# Patient Record
Sex: Female | Born: 1935 | Race: White | Hispanic: No | Smoking: Never smoker
Health system: Southern US, Community
[De-identification: ages and names within clinical notes are randomized; demographics above are authoritative.]

## PROBLEM LIST (undated history)

## (undated) HISTORY — PX: CHOLECYSTECTOMY: SHX55

## (undated) HISTORY — PX: ABDOMINAL HYSTERECTOMY: SHX81

---

## 2004-02-16 ENCOUNTER — Ambulatory Visit: Payer: Self-pay | Admitting: Family Medicine

## 2005-02-21 ENCOUNTER — Ambulatory Visit: Payer: Self-pay | Admitting: Family Medicine

## 2019-11-23 ENCOUNTER — Other Ambulatory Visit: Payer: Self-pay

## 2019-11-23 ENCOUNTER — Inpatient Hospital Stay
Admission: EM | Admit: 2019-11-23 | Discharge: 2019-11-28 | DRG: 521 | Disposition: A | Discharge status: Skilled Nursing Facility | Payer: Medicare Other | Source: Skilled Nursing Facility | Attending: Student | Admitting: Student

## 2019-11-23 ENCOUNTER — Emergency Department: Payer: Medicare Other

## 2019-11-23 DIAGNOSIS — Z96649 Presence of unspecified artificial hip joint: Secondary | ICD-10-CM

## 2019-11-23 DIAGNOSIS — S0003XA Contusion of scalp, initial encounter: Secondary | ICD-10-CM | POA: Diagnosis present

## 2019-11-23 DIAGNOSIS — Z9049 Acquired absence of other specified parts of digestive tract: Secondary | ICD-10-CM

## 2019-11-23 DIAGNOSIS — F0390 Unspecified dementia without behavioral disturbance: Secondary | ICD-10-CM | POA: Diagnosis not present

## 2019-11-23 DIAGNOSIS — Z90710 Acquired absence of both cervix and uterus: Secondary | ICD-10-CM

## 2019-11-23 DIAGNOSIS — S72001A Fracture of unspecified part of neck of right femur, initial encounter for closed fracture: Secondary | ICD-10-CM | POA: Diagnosis present

## 2019-11-23 DIAGNOSIS — Z419 Encounter for procedure for purposes other than remedying health state, unspecified: Secondary | ICD-10-CM

## 2019-11-23 DIAGNOSIS — F05 Delirium due to known physiological condition: Secondary | ICD-10-CM | POA: Diagnosis not present

## 2019-11-23 DIAGNOSIS — D62 Acute posthemorrhagic anemia: Secondary | ICD-10-CM | POA: Diagnosis not present

## 2019-11-23 DIAGNOSIS — K5900 Constipation, unspecified: Secondary | ICD-10-CM | POA: Diagnosis not present

## 2019-11-23 DIAGNOSIS — M8580 Other specified disorders of bone density and structure, unspecified site: Secondary | ICD-10-CM | POA: Diagnosis present

## 2019-11-23 DIAGNOSIS — F0391 Unspecified dementia with behavioral disturbance: Secondary | ICD-10-CM | POA: Diagnosis present

## 2019-11-23 DIAGNOSIS — W19XXXA Unspecified fall, initial encounter: Secondary | ICD-10-CM | POA: Diagnosis present

## 2019-11-23 DIAGNOSIS — Y92129 Unspecified place in nursing home as the place of occurrence of the external cause: Secondary | ICD-10-CM | POA: Diagnosis not present

## 2019-11-23 DIAGNOSIS — M25551 Pain in right hip: Secondary | ICD-10-CM

## 2019-11-23 DIAGNOSIS — W010XXA Fall on same level from slipping, tripping and stumbling without subsequent striking against object, initial encounter: Secondary | ICD-10-CM | POA: Diagnosis present

## 2019-11-23 DIAGNOSIS — D696 Thrombocytopenia, unspecified: Secondary | ICD-10-CM | POA: Diagnosis present

## 2019-11-23 DIAGNOSIS — J9601 Acute respiratory failure with hypoxia: Secondary | ICD-10-CM | POA: Diagnosis not present

## 2019-11-23 DIAGNOSIS — Z8249 Family history of ischemic heart disease and other diseases of the circulatory system: Secondary | ICD-10-CM | POA: Diagnosis not present

## 2019-11-23 DIAGNOSIS — R001 Bradycardia, unspecified: Secondary | ICD-10-CM | POA: Diagnosis present

## 2019-11-23 DIAGNOSIS — R739 Hyperglycemia, unspecified: Secondary | ICD-10-CM | POA: Diagnosis present

## 2019-11-23 DIAGNOSIS — Z20822 Contact with and (suspected) exposure to covid-19: Secondary | ICD-10-CM | POA: Diagnosis present

## 2019-11-23 DIAGNOSIS — R509 Fever, unspecified: Secondary | ICD-10-CM

## 2019-11-23 DIAGNOSIS — I441 Atrioventricular block, second degree: Secondary | ICD-10-CM | POA: Diagnosis present

## 2019-11-23 DIAGNOSIS — E0781 Sick-euthyroid syndrome: Secondary | ICD-10-CM | POA: Diagnosis present

## 2019-11-23 DIAGNOSIS — R339 Retention of urine, unspecified: Secondary | ICD-10-CM | POA: Diagnosis present

## 2019-11-23 DIAGNOSIS — R5381 Other malaise: Secondary | ICD-10-CM | POA: Diagnosis not present

## 2019-11-23 DIAGNOSIS — S0990XA Unspecified injury of head, initial encounter: Secondary | ICD-10-CM

## 2019-11-23 DIAGNOSIS — R338 Other retention of urine: Secondary | ICD-10-CM | POA: Diagnosis not present

## 2019-11-23 DIAGNOSIS — Z7189 Other specified counseling: Secondary | ICD-10-CM | POA: Diagnosis not present

## 2019-11-23 HISTORY — DX: Unspecified dementia, unspecified severity, without behavioral disturbance, psychotic disturbance, mood disturbance, and anxiety: F03.90

## 2019-11-23 HISTORY — DX: Unspecified fall, initial encounter: W19.XXXA

## 2019-11-23 LAB — TYPE AND SCREEN
ABO/RH(D): O POS
Antibody Screen: NEGATIVE

## 2019-11-23 LAB — CBC
HCT: 46.7 % — ABNORMAL HIGH (ref 36.0–46.0)
Hemoglobin: 16 g/dL — ABNORMAL HIGH (ref 12.0–15.0)
MCH: 31.3 pg (ref 26.0–34.0)
MCHC: 34.3 g/dL (ref 30.0–36.0)
MCV: 91.2 fL (ref 80.0–100.0)
Platelets: 183 10*3/uL (ref 150–400)
RBC: 5.12 MIL/uL — ABNORMAL HIGH (ref 3.87–5.11)
RDW: 12.7 % (ref 11.5–15.5)
WBC: 9.3 10*3/uL (ref 4.0–10.5)
nRBC: 0 % (ref 0.0–0.2)

## 2019-11-23 LAB — COMPREHENSIVE METABOLIC PANEL
ALT: 18 U/L (ref 0–44)
AST: 24 U/L (ref 15–41)
Albumin: 4.4 g/dL (ref 3.5–5.0)
Alkaline Phosphatase: 80 U/L (ref 38–126)
Anion gap: 13 (ref 5–15)
BUN: 16 mg/dL (ref 8–23)
CO2: 27 mmol/L (ref 22–32)
Calcium: 9.6 mg/dL (ref 8.9–10.3)
Chloride: 99 mmol/L (ref 98–111)
Creatinine, Ser: 0.85 mg/dL (ref 0.44–1.00)
GFR calc Af Amer: >60 mL/min (ref >60)
GFR calc non Af Amer: >60 mL/min (ref >60)
Glucose, Bld: 143 mg/dL — ABNORMAL HIGH (ref 70–99)
Potassium: 3.9 mmol/L (ref 3.5–5.1)
Sodium: 139 mmol/L (ref 135–145)
Total Bilirubin: 1.1 mg/dL (ref 0.3–1.2)
Total Protein: 8.7 g/dL — ABNORMAL HIGH (ref 6.5–8.1)

## 2019-11-23 LAB — MRSA PCR SCREENING: MRSA by PCR: NEGATIVE

## 2019-11-23 LAB — APTT: aPTT: 34 seconds (ref 24–36)

## 2019-11-23 LAB — PROTIME-INR
INR: 1 (ref 0.8–1.2)
Prothrombin Time: 12.5 seconds (ref 11.4–15.2)

## 2019-11-23 LAB — SARS CORONAVIRUS 2 BY RT PCR (HOSPITAL ORDER, PERFORMED IN ~~LOC~~ HOSPITAL LAB): SARS Coronavirus 2: NEGATIVE

## 2019-11-23 MED ORDER — OXYCODONE HCL 5 MG PO TABS
5.0000 mg | ORAL_TABLET | Freq: Once | ORAL | Status: AC
  Administered 2019-11-23: 5 mg via ORAL
  Filled 2019-11-23: qty 1

## 2019-11-23 MED ORDER — METHOCARBAMOL 500 MG PO TABS
500.0000 mg | ORAL_TABLET | Freq: Three times a day (TID) | ORAL | Status: DC | PRN
  Administered 2019-11-23: 500 mg via ORAL
  Filled 2019-11-23 (×3): qty 1

## 2019-11-23 MED ORDER — CEFAZOLIN SODIUM-DEXTROSE 2-4 GM/100ML-% IV SOLN
2.0000 g | Freq: Once | INTRAVENOUS | Status: AC
  Administered 2019-11-24: 2 g via INTRAVENOUS
  Filled 2019-11-23: qty 100

## 2019-11-23 MED ORDER — SENNOSIDES-DOCUSATE SODIUM 8.6-50 MG PO TABS: 1.0000 | ORAL_TABLET | Freq: Every evening | ORAL | Status: DC | PRN

## 2019-11-23 MED ORDER — ACETAMINOPHEN 325 MG PO TABS: 650.0000 mg | ORAL_TABLET | Freq: Four times a day (QID) | ORAL | Status: DC | PRN

## 2019-11-23 MED ORDER — ONDANSETRON HCL 4 MG/2ML IJ SOLN
4.0000 mg | Freq: Three times a day (TID) | INTRAMUSCULAR | Status: DC | PRN
  Administered 2019-11-23 – 2019-11-24 (×2): 4 mg via INTRAVENOUS
  Filled 2019-11-23 (×2): qty 2

## 2019-11-23 MED ORDER — HALOPERIDOL LACTATE 5 MG/ML IJ SOLN
1.0000 mg | Freq: Once | INTRAMUSCULAR | Status: AC
  Administered 2019-11-23: 1 mg via INTRAVENOUS
  Filled 2019-11-23: qty 1

## 2019-11-23 MED ORDER — ACETAMINOPHEN 500 MG PO TABS
1000.0000 mg | ORAL_TABLET | Freq: Once | ORAL | Status: AC
  Administered 2019-11-23: 1000 mg via ORAL
  Filled 2019-11-23: qty 2

## 2019-11-23 MED ORDER — ATROPINE SULFATE 1 MG/10ML IJ SOSY: 0.5000 mg | PREFILLED_SYRINGE | INTRAMUSCULAR | Status: DC | PRN

## 2019-11-23 MED ORDER — OXYCODONE-ACETAMINOPHEN 5-325 MG PO TABS
1.0000 | ORAL_TABLET | ORAL | Status: DC | PRN
  Administered 2019-11-23 – 2019-11-24 (×2): 1 via ORAL
  Filled 2019-11-23 (×3): qty 1

## 2019-11-23 MED ORDER — MORPHINE SULFATE (PF) 2 MG/ML IV SOLN
0.5000 mg | INTRAVENOUS | Status: DC | PRN
  Administered 2019-11-23 (×2): 0.5 mg via INTRAVENOUS
  Filled 2019-11-23 (×2): qty 1

## 2019-11-23 NOTE — ED Notes (Signed)
Pt bradying down into the 30s for mins at a time then goes back up to 70s with stimulation. BP stable and mentation remains the same. Dr. Clyde Lundborg updated.

## 2019-11-23 NOTE — Anesthesia Preprocedure Evaluation (Addendum)
Anesthesia Evaluation  Patient identified by MRN, date of birth, ID band Patient awake    Reviewed: Allergy & Precautions, H&P , NPO status , Patient's Chart, lab work & pertinent test results  Airway Mallampati: II  TM Distance: >3 FB Neck ROM: limited    Dental  (+) Dental Advidsory Given   Pulmonary neg pulmonary ROS, neg COPD,    Pulmonary exam normal        Cardiovascular (-) Past MI and (-) Cardiac Stents Normal cardiovascular exam+ dysrhythmias (Asymptomatic bradycardia to 30's noted on admission c/w Mobitz type 1 AV block.  Cardiology has cleared her for surgery.  Bradycardia should be responsive to atropine if needed)      Neuro/Psych PSYCHIATRIC DISORDERS (moderate to severe dementia) Dementia negative neurological ROS     GI/Hepatic negative GI ROS, Neg liver ROS,   Endo/Other  negative endocrine ROS  Renal/GU      Musculoskeletal   Abdominal   Peds  Hematology negative hematology ROS (+)   Anesthesia Other Findings Past Medical History: 11/23/2019: Dementia (HCC)     Comment:  Per daughter 11/23/2019: Fall Past Surgical History: No date: ABDOMINAL HYSTERECTOMY No date: CHOLECYSTECTOMY  BMI   Body Mass Index: 30.24 kg/m      Reproductive/Obstetrics negative OB ROS                           Anesthesia Physical Anesthesia Plan  ASA: III  Anesthesia Plan: Spinal   Post-op Pain Management:    Induction:   PONV Risk Score and Plan: Propofol infusion and TIVA  Airway Management Planned: Natural Airway and Simple Face Mask  Additional Equipment:   Intra-op Plan:   Post-operative Plan:   Informed Consent: I have reviewed the patients History and Physical, chart, labs and discussed the procedure including the risks, benefits and alternatives for the proposed anesthesia with the patient or authorized representative who has indicated his/her understanding and acceptance.      Dental Advisory Given  Plan Discussed with: Anesthesiologist, CRNA and Surgeon  Anesthesia Plan Comments: (Consent for anesthesia obtained from daughter Verdene Rio at [phone]. Plan for spinal.  KR)      Anesthesia Quick Evaluation

## 2019-11-23 NOTE — Progress Notes (Addendum)
Patient very agitated trying to get up out of bed. MD ordered Haldol for her agitation. Have to remind her several times a minute leg is broken and can not get out of bed. Will need a sitter. Easily agitated and irritable. Mats have been placed on floor beside bed on both sides for safety. Ordering tele sitter for patient.

## 2019-11-23 NOTE — Consult Note (Signed)
ORTHOPAEDIC CONSULTATION  REQUESTING PHYSICIAN: Jane Harp, MD  Chief Complaint:   R hip pain  History of Present Illness: History obtained from discussion with medical providers, the patient's daughter, and review of medical record.  Jane Bailey is a 86 y.o. female with a history of significant dementia who had a fall at her skilled nursing facility that is her primary residence.  The patient noted immediate hip pain and inability to ambulate.  The patient ambulates unassisted at baseline.  She also had a fall a few months ago according to her daughter.  Pain is rated a 10 out of 10 in severity.  Pain is improved with rest and immobilization.  Pain is worse with any sort of movement.  X-rays in the emergency department show a R displaced femoral neck fracture.  Past Medical History:  Diagnosis Date  . Dementia (HCC) 11/23/2019   Per daughter  . Fall 11/23/2019   History reviewed. No pertinent surgical history. Social History   Socioeconomic History  . Marital status: Unknown    Spouse name: Not on file  . Number of children: Not on file  . Years of education: Not on file  . Highest education level: Not on file  Occupational History  . Not on file  Tobacco Use  . Smoking status: Never Smoker  . Smokeless tobacco: Never Used  Vaping Use  . Vaping Use: Never used  Substance and Sexual Activity  . Alcohol use: Never  . Drug use: Never  . Sexual activity: Not Currently  Other Topics Concern  . Not on file  Social History Narrative  . Not on file   Social Determinants of Health   Financial Resource Strain:   . Difficulty of Paying Living Expenses:   Food Insecurity:   . Worried About Programme researcher, broadcasting/film/video in the Last Year:   . Barista in the Last Year:   Transportation Needs:   . Freight forwarder (Medical):   Marland Kitchen Lack of Transportation (Non-Medical):   Physical Activity:   . Days of  Exercise per Week:   . Minutes of Exercise per Session:   Stress:   . Feeling of Stress :   Social Connections:   . Frequency of Communication with Friends and Family:   . Frequency of Social Gatherings with Friends and Family:   . Attends Religious Services:   . Active Member of Clubs or Organizations:   . Attends Banker Meetings:   Marland Kitchen Marital Status:    No family history on file. No Known Allergies Prior to Admission medications   Medication Sig Start Date End Date Taking? Authorizing Provider  Multiple Vitamins-Minerals (CERTA PLUS SENIOR PO) Take 1 tablet by mouth daily.   Yes [provider]  [facility] powder Apply 1 application topically in the morning and at bedtime. 10/22/19  Yes [provider]   Recent Labs    11/23/19 0840  WBC 9.3  HGB 16.0*  HCT 46.7*  PLT 183  K 3.9  CL 99  CO2 27  BUN 16  CREATININE 0.85  GLUCOSE 143*  CALCIUM 9.6  INR 1.0   CT Head Wo Contrast  Result Date: 11/23/2019 CLINICAL DATA:  Larey Seat this morning.  Patient with baseline dementia. EXAM: CT HEAD WITHOUT CONTRAST TECHNIQUE: Contiguous axial images were obtained from the base of the skull through the vertex without intravenous contrast. COMPARISON:  None. FINDINGS: Brain: No evidence of acute infarction, hemorrhage, hydrocephalus, extra-axial collection or mass lesion/mass effect.  Mild patchy areas of white matter hypoattenuation are noted consistent with chronic microvascular ischemic change. Vascular: No hyperdense vessel or unexpected calcification. Skull: Normal. Negative for fracture or focal lesion. Sinuses/Orbits: Visualized globes and orbits are unremarkable. The visualized sinuses and mastoid air cells are clear. Other: Small right parietal scalp hematoma. IMPRESSION: 1. No acute intracranial abnormalities. 2. Small right parietal scalp hematoma.  No skull fracture Electronically Signed   By: Amie Portland M.D.   On: 11/23/2019 10:22   DG Hip Unilat W or Wo  Pelvis 2-3 Views Right  Result Date: 11/23/2019 CLINICAL DATA:  Fall with RIGHT hip deformity. EXAM: DG HIP (WITH OR WITHOUT PELVIS) 2-3V RIGHT COMPARISON:  None FINDINGS: Osteopenia with complete displacement of a RIGHT femoral neck fracture and superior migration of the distal femur relative to the femoral head. Femoral head appears located on submitted views. Degree of comminution may be present in there is sclerosis along the fracture margin some of this irregularity could be due to overlapping bone. Is and degree of osteopenia. No additional fractures. IMPRESSION: Complete displacement of a RIGHT femoral neck fracture. Superior migration of the distal femur relative to femoral head. Character of the bone is abnormal, potentially related to the degree of osteopenia. Based on appearance pathologic fracture could also be considered. Electronically Signed   By: Donzetta Kohut M.D.   On: 11/23/2019 09:53     Positive ROS: All other systems have been reviewed and were otherwise negative with the exception of those mentioned in the HPI and as above.  Physical Exam: BP (!) 134/65   Pulse 66   Temp 98 F (36.7 C) (Oral)   Resp 18   Ht 5' 7.5" (1.715 m)   Wt 88.9 kg   SpO2 90%   BMI 30.24 kg/m  General:  Alert, no acute distress Psychiatric:  Patient is NOT competent for consent with normal mood and affect, not agitated   Cardiovascular:  No pedal edema, regular rate and rhythm Respiratory:  No wheezing, non-labored breathing GI:  Abdomen is soft and non-tender Skin:  No lesions in the area of chief complaint, no erythema Neurologic:  Sensation intact distally, CN grossly intact Lymphatic:  No axillary or cervical lymphadenopathy  Orthopedic Exam:  RLE: 5/5 DF/PF/EHL SILT s/s/t/sp/dp distr Foot wwp +Log roll/axial load   X-rays:  As above: R displaced femoral neck fracture.  Assessment/Plan: [name] is a 86 y.o. female with a R displaced femoral neck fracture.   1. I  discussed the various treatment options including both surgical and non-surgical management of her fracture with the patient's daughter (medical PoA). We discussed the high risk of perioperative complications due to patient's age, dementia, and other co-morbidities. After discussion of risks, benefits, and alternatives to surgery, the patient's daughter was in agreement to proceed with surgery. The goals of surgery would be to provide adequate pain relief and allow for mobilization. Plan for surgery is R hip hemiarthroplasty tomorrow, 11/24/2019. 2. NPO after midnight 3. Hold anticoagulation in advance of OR 4. Admit to Hospitalist service for medical optimization.  Of note, the patient was noted to have possible second-degree AV block with bradycardia.  The cardiology team was consulted and she was cleared to proceed with surgery at this time.     Signa Kell   11/23/2019 12:49 PM

## 2019-11-23 NOTE — H&P (Addendum)
History and Physical    Jane Bailey OVZ:[id number] DOB: 06-30-1930 DOA: 11/23/2019  Referring MD/NP/PA:   PCP: Drue Flirt, MD   Patient coming from:  The patient is coming from SNF.  At baseline, pt is dependent for most of ADL.        Chief Complaint: fall and right hip pain  HPI: Jane Bailey is a 86 y.o. female with medical history significant of dementia, who presents with fall and left hip pain.  Per report, patient fell accidentally in the SNF in early morning, no loss of consciousness.  She injured her right hip and right side of the head.  Patient complains of right hip pain, which is constant, sharp, severe.  No leg numbness. She also has some mild headache, No neck pain. Patient does not have facial droop or slurred speech.  Denies chest pain, shortness breath, cough, fever or chills.  No nausea vomiting, diarrhea, abdominal pain, symptoms of UTI.   Of note, patient was found to have bradycardia ED with heart rate down to 30s, then come back to 40-60s.  Stat EKG was done, which showed Mobitz type I AV block.  ED Course: pt was found to have WBC 9.3, negative COVID-19 PCR, electrolytes renal function okay, temperature normal, blood pressure 136/81, RR 26, oxygen saturation 93-95% on room air. CT-head negative for acute intracranial abnormalities, but it showed a small right parietal scalp hematoma.  Patient is admitted to progressive bed as inpatient.  Orthopedic surgeon, Dr. Allena Katz and Dr. Gwen Pounds of cardiology were consulted.  X-ray of right hip/pelvis: Complete displacement of a RIGHT femoral neck fracture. Superior migration of the distal femur relative to femoral head. Character of the bone is abnormal, potentially related to the degree of osteopenia. Based on appearance pathologic fracture could also be considered.   Review of Systems:   General: no fevers, chills, no body weight gain, has fatigue HEENT: no blurry vision, hearing changes or sore  throat Respiratory: no dyspnea, coughing, wheezing CV: no chest pain, no palpitations GI: no nausea, vomiting, abdominal pain, diarrhea, constipation GU: no dysuria, burning on urination, increased urinary frequency, hematuria  Ext: no leg edema Neuro: no unilateral weakness, numbness, or tingling, no vision change or hearing loss. Has fall. Skin: no rash, no skin tear. MSK: has right hip pain Heme: No easy bruising.  Travel history: No recent long distant travel.  Allergy: No Known Allergies  Past Medical History:  Diagnosis Date  . Dementia (HCC) 11/23/2019   Per daughter  . Fall 11/23/2019    Past Surgical History:  Procedure Laterality Date  . ABDOMINAL HYSTERECTOMY    . CHOLECYSTECTOMY      Social History:  reports that she has never smoked. She has never used smokeless tobacco. She reports that she does not drink alcohol and does not use drugs.  Family History:  Family History  Problem Relation Age of Onset  . Heart attack Father   . Heart attack Brother      Prior to Admission medications   Not on File    Physical Exam: Vitals:   11/23/19 1432 11/23/19 1433 11/23/19 1434 11/23/19 1435  BP:      Pulse: 71 66 68 66  Resp: 16 (!) 26 (!) 26 19  Temp:      TempSrc:      SpO2: 91% 90% 97% 93%  Weight:      Height:       General: Not in acute distress HEENT:  Eyes: PERRL, EOMI, no scleral icterus.       ENT: No discharge from the ears and nose, no pharynx injection, no tonsillar enlargement.        Neck: No JVD, no bruit, no mass felt. Heme: No neck lymph node enlargement. Cardiac: S1/S2, RRR, No murmurs, No gallops or rubs. Respiratory: No rales, wheezing, rhonchi or rubs. GI: Soft, nondistended, nontender, no rebound pain, no organomegaly, BS present. GU: No hematuria Ext: No pitting leg edema bilaterally. 1+DP/PT pulse bilaterally. Musculoskeletal: has right hip tenderness. Skin: No rashes.  Neuro: Alert, oriented X3, cranial nerves II-XII  grossly intact, moves all extremities. Psych: Patient is not psychotic, no suicidal or hemocidal ideation.  Labs on Admission: I have personally reviewed following labs and imaging studies  CBC: Recent Labs  Lab 11/23/19 0840  WBC 9.3  HGB 16.0*  HCT 46.7*  MCV 91.2  PLT 183   Basic Metabolic Panel: Recent Labs  Lab 11/23/19 0840  NA 139  K 3.9  CL 99  CO2 27  GLUCOSE 143*  BUN 16  CREATININE 0.85  CALCIUM 9.6   GFR: Estimated Creatinine Clearance: 53.9 mL/min (by C-G formula based on SCr of 0.85 mg/dL). Liver Function Tests: Recent Labs  Lab 11/23/19 0840  AST 24  ALT 18  ALKPHOS 80  BILITOT 1.1  PROT 8.7*  ALBUMIN 4.4   No results for input(s): LIPASE, AMYLASE in the last 168 hours. No results for input(s): AMMONIA in the last 168 hours. Coagulation Profile: Recent Labs  Lab 11/23/19 0840  INR 1.0   Cardiac Enzymes: No results for input(s): CKTOTAL, CKMB, CKMBINDEX, TROPONINI in the last 168 hours. BNP (last 3 results) No results for input(s): PROBNP in the last 8760 hours. HbA1C: No results for input(s): HGBA1C in the last 72 hours. CBG: No results for input(s): GLUCAP in the last 168 hours. Lipid Profile: No results for input(s): CHOL, HDL, LDLCALC, TRIG, CHOLHDL, LDLDIRECT in the last 72 hours. Thyroid Function Tests: No results for input(s): TSH, T4TOTAL, FREET4, T3FREE, THYROIDAB in the last 72 hours. Anemia Panel: No results for input(s): VITAMINB12, FOLATE, FERRITIN, TIBC, IRON, RETICCTPCT in the last 72 hours. Urine analysis: No results found for: COLORURINE, APPEARANCEUR, LABSPEC, PHURINE, GLUCOSEU, HGBUR, BILIRUBINUR, KETONESUR, PROTEINUR, UROBILINOGEN, NITRITE, LEUKOCYTESUR Sepsis Labs: @LABRCNTIP (procalcitonin:4,lacticidven:4) ) Recent Results (from the past 240 hour(s))  SARS Coronavirus 2 by RT PCR (hospital order, performed in [facility] hospital lab) Nasopharyngeal Nasopharyngeal Swab     Status: None   Collection Time: 11/23/19   8:41 AM   Specimen: Nasopharyngeal Swab  Result Value Ref Range Status   SARS Coronavirus 2 NEGATIVE NEGATIVE Final    Comment: (NOTE) SARS-CoV-2 target nucleic acids are NOT DETECTED.  The SARS-CoV-2 RNA is generally detectable in upper and lower respiratory specimens during the acute phase of infection. The lowest concentration of SARS-CoV-2 viral copies this assay can detect is 250 copies / mL. A negative result does not preclude SARS-CoV-2 infection and should not be used as the sole basis for treatment or other patient management decisions.  A negative result may occur with improper specimen collection / handling, submission of specimen other than nasopharyngeal swab, presence of viral mutation(s) within the areas targeted by this assay, and inadequate number of viral copies (<250 copies / mL). A negative result must be combined with clinical observations, patient history, and epidemiological information.  Fact Sheet for Patients:   11/25/19  Fact Sheet for Healthcare Providers: BoilerBrush.com.cy  This test is not yet approved or  cleared by the [place] FDA and has been authorized for detection and/or diagnosis of SARS-CoV-2 by FDA under an Emergency Use Authorization (EUA).  This EUA will remain in effect (meaning this test can be used) for the duration of the COVID-19 declaration under Section 564(b)(1) of the Act, 21 U.S.C. section 360bbb-3(b)(1), unless the authorization is terminated or revoked sooner.  Performed at [facility], [address] [place], [place] [address]      Radiological Exams on Admission: CT Head Wo Contrast  Result Date: 11/23/2019 CLINICAL DATA:  Larey SeatFell this morning.  Patient with baseline dementia. EXAM: CT HEAD WITHOUT CONTRAST TECHNIQUE: Contiguous axial images were obtained from the base of the skull through the vertex without intravenous contrast. COMPARISON:  None.  FINDINGS: Brain: No evidence of acute infarction, hemorrhage, hydrocephalus, extra-axial collection or mass lesion/mass effect. Mild patchy areas of white matter hypoattenuation are noted consistent with chronic microvascular ischemic change. Vascular: No hyperdense vessel or unexpected calcification. Skull: Normal. Negative for fracture or focal lesion. Sinuses/Orbits: Visualized globes and orbits are unremarkable. The visualized sinuses and mastoid air cells are clear. Other: Small right parietal scalp hematoma. IMPRESSION: 1. No acute intracranial abnormalities. 2. Small right parietal scalp hematoma.  No skull fracture Electronically Signed   By: Amie Portlandavid  Ormond M.D.   On: 11/23/2019 10:22   DG Hip Unilat W or Wo Pelvis 2-3 Views Right  Result Date: 11/23/2019 CLINICAL DATA:  Fall with RIGHT hip deformity. EXAM: DG HIP (WITH OR WITHOUT PELVIS) 2-3V RIGHT COMPARISON:  None FINDINGS: Osteopenia with complete displacement of a RIGHT femoral neck fracture and superior migration of the distal femur relative to the femoral head. Femoral head appears located on submitted views. Degree of comminution may be present in there is sclerosis along the fracture margin some of this irregularity could be due to overlapping bone. Is and degree of osteopenia. No additional fractures. IMPRESSION: Complete displacement of a RIGHT femoral neck fracture. Superior migration of the distal femur relative to femoral head. Character of the bone is abnormal, potentially related to the degree of osteopenia. Based on appearance pathologic fracture could also be considered. Electronically Signed   By: Donzetta KohutGeoffrey  Wile M.D.   On: 11/23/2019 09:53     EKG: Independently reviewed.  First EKG showed sinus rhythm, QTC 469, nonspecific T wave change.  The repeated EKG showed bradycardia, seems to be Mobitz type I AV block per Dr. Gwen PoundsKowalski  Assessment/Plan Principal Problem:   Closed displaced fracture of right femoral neck (HCC) Active  Problems:   Fall   Dementia [facility](HCC)   Bradycardia   Closed displaced fracture of right femoral neck (HCC):  As evidenced by x-ray. Patient has moderate pain now. No neurovascular compromise. Orthopedic surgeon, Dr. Allena KatzPatel was consulted.   - will admit to progressive bed as inpatient - Pain control: morphine prn and percocet - When necessary Zofran for nausea - Robaxin for muscle spasm - type and cross - INR/PTT - Will consult cardiology for presurgical clearance due to bradycardia  Fall: - PT/OT when able to (not ordered now)  Dementia [facility](HCC): Patient is calm, no behavior disturbance. -monioring  Bradycardia: Heart rate is down to 30s, currently heart rates of 40s-60s.  Patient is asymptomatic.  Hemodynamic stable currently.  EKG showed possible Mobitz type I AV block.  Dr. Gwen PoundsKowalski of cardiology is consulted. -Avoid using nodal blocker -As needed atropine for symptomatic bradycardia -Follow-up cardiologist recommendation -check TSH    DVT ppx: SCD Code Status: Full code Family Communication:  Yes, patient's  Son at bed side Disposition Plan:  Anticipate discharge back to previous environment Consults called:  Dr. Allena Katz of ortho and Dr. Gwen Pounds of card Admission status:  progressive unit as inpt    Status is: Inpatient  Remains inpatient appropriate because:Inpatient level of care appropriate due to severity of illness   Dispo: The patient is from: SNF              Anticipated d/c is to: SNF              Anticipated d/c date is: 2 days              Patient currently is not medically stable to d/c.           Date of Service 11/23/2019    Lorretta Harp [facility]   If 7PM-7AM, please contact night-coverage [email or web address] 11/23/2019, 4:14 PM

## 2019-11-23 NOTE — ED Triage Notes (Signed)
Patient arrived via EMS from [facility]. Patient is at baseline, patient does have dementia and is AOx2-3. Unsure how patient ambulates. Patient fell this morning walking out front door and fell on right hip and did hit right side of head. There is a small hematoma on right side. Pupils equal and reactive, bilateral distal pedal pulses equal and strong.

## 2019-11-23 NOTE — Progress Notes (Signed)
Patient admit from [facility] from fall this morning resulting in Right hip fracture. Dementia and extreme forgetfulness. Ordered Telesitter for her. Son Maurine Minister at bedside and aware of condition. Gave ordered 1mg  Haldol for agitation. Saying she has to pee constantly but refusing to use purewick insisting she get out of bed and go to bathroom. Tries to move, realizes leg hurts then cycle starts again reminding her where she is and why, and why she can not get out of bed but she says I HAVE to get up. Marland KitchenMarland Kitchen

## 2019-11-23 NOTE — ED Provider Notes (Signed)
[facility] Emergency Department Provider Note ____________________________________________   First MD Initiated Contact with Patient 11/23/19 0831     (approximate)  I have reviewed the triage vital signs and the nursing notes.  HISTORY  Chief Complaint Fall and Right Hip Deformity   HPI Jane Bailey is a 86 y.o. female who presents to the ED for evaluation after a fall.    Patient presents from a local SNF after a fall this morning.  While not documented, verbal reports indicate concern for early dementia and chronic disorientation.  Medication lists from SNF only include vitamins and no significant chronic medications.  Patient reports being ambulatory at baseline without assistance device.  Patient and EMS report a fall earlier this morning while patient was getting up to use the restroom after sleep last night.  Patient reports her feet slipping out from under her, causing fall onto her right side, impacting her right hip and right head.  Patient denies syncope with the event, or preceding chest pain, shortness of breath or headache.  Patient is now reporting 9/10 intensity right hip pain, worse with movement, aching in nature and relieved by immobility.  She has not taken any medications for this.  No events on route with EMS. She reports her headache is "not bad" in comparison to her right hip.  Denies vision changes or syncope.  Patient denies any recent illnesses.  She is uncertain if she is on blood thinners.  And medication list does not state that she is.  Past Medical History:  Diagnosis Date  . Dementia (HCC) 11/23/2019   Per daughter  . Fall 11/23/2019    Patient Active Problem List   Diagnosis Date Noted  . Fall 11/23/2019  . Fracture of femoral neck, right, closed (HCC) 11/23/2019  . Dementia (HCC) 11/23/2019  . Closed displaced fracture of right femoral neck (HCC) 11/23/2019    Prior to Admission medications   Not on File     Allergies Patient has no known allergies.  No family history on file.  Social History Social History   Tobacco Use  . Smoking status: Never Smoker  . Smokeless tobacco: Never Used  Vaping Use  . Vaping Use: Never used  Substance Use Topics  . Alcohol use: Never  . Drug use: Never    Review of Systems  Constitutional: No fever/chills Eyes: No visual changes. ENT: No sore throat. Cardiovascular: Denies chest pain. Respiratory: Denies shortness of breath. Gastrointestinal: No abdominal pain.  No nausea, no vomiting.  No diarrhea.  No constipation. Genitourinary: Negative for dysuria. Musculoskeletal: Negative for back pain.  Positive for right hip pain. Skin: Negative for rash. Neurological: Negative for focal weakness or numbness.  Positive for posttraumatic headache.   ____________________________________________   PHYSICAL EXAM:  VITAL SIGNS: Vitals:   11/23/19 0831 11/23/19 1032  BP:    Pulse: 62 63  Resp: (!) 26 16  Temp:    SpO2: 95% 96%      Constitutional: Alert and oriented to person and place only.  Laying supine with her right hip flexed and adducted, moaning in pain whenever she was transferred or moved, but settles down when immobile and conversational in full sentences. Eyes: Conjunctivae are normal. PERRL. EOMI. Head: Small right parietal hematoma without discrete laceration or signs of bleeding. Nose: No congestion/rhinnorhea. Mouth/Throat: Mucous membranes are moist.  Oropharynx non-erythematous. Neck: No stridor. No cervical spine tenderness to palpation. Cardiovascular: Normal rate, regular rhythm. Grossly normal heart sounds.  Good peripheral  circulation. Respiratory: Normal respiratory effort.  No retractions. Lungs CTAB. Gastrointestinal: Soft , nondistended, nontender to palpation. No abdominal bruits. No CVA tenderness. Musculoskeletal: Right hip is flexed and adducted and RLE he is distally neurovascularly intact.  No external signs  of trauma to include laceration, hematoma or bruising.  Palpation of all 4 extremities otherwise no evidence of acute trauma or areas of tenderness. Neurologic:  Normal speech and language. No gross focal neurologic deficits are appreciated. No gait instability noted. Skin:  Skin is warm, dry and intact. No rash noted. Psychiatric: Mood and affect are normal. Speech and behavior are normal.  ____________________________________________   LABS (all labs ordered are listed, but only abnormal results are displayed)  Labs Reviewed  COMPREHENSIVE METABOLIC PANEL - Abnormal; Notable for the following components:      Result Value   Glucose, Bld 143 (*)    Total Protein 8.7 (*)    All other components within normal limits  CBC - Abnormal; Notable for the following components:   RBC 5.12 (*)    Hemoglobin 16.0 (*)    HCT 46.7 (*)    All other components within normal limits  SARS CORONAVIRUS 2 BY RT PCR (HOSPITAL ORDER, PERFORMED IN [facility])  URINALYSIS, ROUTINE W REFLEX MICROSCOPIC  PROTIME-INR  TYPE AND SCREEN   ____________________________________________  12 Lead EKG Sinus rhythm, rate 65 bpm and normal axis.  Intervals WNL.  No evidence of acute ischemia.  ____________________________________________  RADIOLOGY  ED MD interpretation: CT head obtained due to fall and dementia, to assess for ICH or fracture, and without evidence of acute cranial pathology.  X-ray pelvis right hip obtained due to fall with deformity and high suspicion for fracture.  Results reviewed and remarkable for transverse right-sided femoral neck fracture.  Official radiology report(s): CT Head Wo Contrast  Result Date: 11/23/2019 CLINICAL DATA:  Larey Seat this morning.  Patient with baseline dementia. EXAM: CT HEAD WITHOUT CONTRAST TECHNIQUE: Contiguous axial images were obtained from the base of the skull through the vertex without intravenous contrast. COMPARISON:  None. FINDINGS: Brain: No  evidence of acute infarction, hemorrhage, hydrocephalus, extra-axial collection or mass lesion/mass effect. Mild patchy areas of white matter hypoattenuation are noted consistent with chronic microvascular ischemic change. Vascular: No hyperdense vessel or unexpected calcification. Skull: Normal. Negative for fracture or focal lesion. Sinuses/Orbits: Visualized globes and orbits are unremarkable. The visualized sinuses and mastoid air cells are clear. Other: Small right parietal scalp hematoma. IMPRESSION: 1. No acute intracranial abnormalities. 2. Small right parietal scalp hematoma.  No skull fracture Electronically Signed   By: Amie Portland M.D.   On: 11/23/2019 10:22   DG Hip Unilat W or Wo Pelvis 2-3 Views Right  Result Date: 11/23/2019 CLINICAL DATA:  Fall with RIGHT hip deformity. EXAM: DG HIP (WITH OR WITHOUT PELVIS) 2-3V RIGHT COMPARISON:  None FINDINGS: Osteopenia with complete displacement of a RIGHT femoral neck fracture and superior migration of the distal femur relative to the femoral head. Femoral head appears located on submitted views. Degree of comminution may be present in there is sclerosis along the fracture margin some of this irregularity could be due to overlapping bone. Is and degree of osteopenia. No additional fractures. IMPRESSION: Complete displacement of a RIGHT femoral neck fracture. Superior migration of the distal femur relative to femoral head. Character of the bone is abnormal, potentially related to the degree of osteopenia. Based on appearance pathologic fracture could also be considered. Electronically Signed   By: Jane Bailey  M.D.   On: 11/23/2019 09:53    ____________________________________________   PROCEDURES and INTERVENTIONS  Procedure(s) performed (including Critical Care):  Procedures  Medications  acetaminophen (TYLENOL) tablet 650 mg (has no administration in time range)  ondansetron (ZOFRAN) injection 4 mg (has no administration in time range)    oxyCODONE-acetaminophen (PERCOCET/ROXICET) 5-325 MG per tablet 1 tablet (has no administration in time range)  morphine 2 MG/ML injection 0.5 mg (has no administration in time range)  methocarbamol (ROBAXIN) tablet 500 mg (has no administration in time range)  acetaminophen (TYLENOL) tablet 1,000 mg (1,000 mg Oral Given 11/23/19 0928)  oxyCODONE (Oxy IR/ROXICODONE) immediate release tablet 5 mg (5 mg Oral Given 11/23/19 0929)    ____________________________________________   INITIAL IMPRESSION / ASSESSMENT AND PLAN / ED COURSE  86 year old woman presenting from local SNF after likely mechanical fall, with evidence of transverse right-sided femoral neck fracture requiring hospitalist admission with orthopedic intervention.  Hemodynamically stable.  Exam with uncomfortable-appearing woman keeping her right hip flexed and adducted.  RLE is distally neurovascularly intact, and there is no evidence of an open fracture.  Small hematoma to the right parietal scalp, otherwise without evidence of acute traumatic pathology.  Neurologically intact without deficits.  Unremarkable blood work.  CT imaging without intracranial hemorrhage or cranial fracture.  Plain films of the pelvis and the right hip confirm transverse right-sided femoral neck fracture.  Consulted orthopedic surgery who plans to intervene tomorrow.  Will admit the patient to hospitalist medicine for further work-up and management.  Clinical Course as of Nov 22 1053  Sat Nov 23, 2019  4765 Plain films reviewed with evidence of transfemoral neck fracture   [DS]  1016 CT reviewed, no evidence of acute intracranial pathology Spoke with Dr. Allena Katz, orthopedic surgery on call.  Plan for surgery tomorrow.  Hospitalist admit.  PT and INR   [DS]  1048 Reassessed.  Educated son, who is now at the bedside, about hip fracture, hospitalist admission and likely intervention tomorrow.  Answer questions.   [DS]    Clinical Course User Index [DS] Delton Prairie, MD     ____________________________________________   FINAL CLINICAL IMPRESSION(S) / ED DIAGNOSES  Final diagnoses:  Displaced fracture of right femoral neck (HCC)  Fall, initial encounter  Injury of head, initial encounter  Minor head injury, initial encounter  Right hip pain     ED Discharge Orders    None       Flonnie Wierman   Note:  This document was prepared using Dragon voice recognition software and may include unintentional dictation errors.   Delton Prairie, MD 11/23/19 1057

## 2019-11-23 NOTE — Consult Note (Signed)
[facility] Cardiology Consultation Note  Patient ID: KEYA WYNES, MRN: [id number], DOB/AGE: 06-28-1930 86 y.o. Admit date: 11/23/2019   Date of Consult: 11/23/2019 Primary Physician: Drue Flirt, MD Primary Cardiologist: None  none Chief Complaint:  Chief Complaint  Patient presents with  . Fall  . Right Hip Deformity   Reason for Consult: Bradycardia  HPI: 86 y.o. female with no apparent previous cardiovascular history or significant risk factors of cardiovascular history who has dementia and has had a significant fall with hip fracture.  The patient was being seen in the emergency room for evaluation and treatment of her hip fracture when there was notice of bradycardia.  The patient has not had any apparent anginal symptoms and/or significant syncope dizziness congestive heart failure or acute coronary syndrome at this time.  What was noticed by EKG was normal sinus rhythm with 2-1 block most consistent with Mobitz type I AV block.  Other rhythm strips also suggest Mobitz type I AV block and no current evidence of further advanced heart block.  This is intermittent and asymptomatic but mainly the patient is in normal sinus rhythm with nonspecific ST changes.  This would suggest that the patient's bradycardia would be responsive to appropriate medication management if necessary including atropine for dopamine in the future  Past Medical History:  Diagnosis Date  . Dementia (HCC) 11/23/2019   Per daughter  . Fall 11/23/2019      Surgical History:  Past Surgical History:  Procedure Laterality Date  . ABDOMINAL HYSTERECTOMY    . CHOLECYSTECTOMY       Home Meds: Prior to Admission medications   Medication Sig Start Date End Date Taking? Authorizing Provider  Multiple Vitamins-Minerals (CERTA PLUS SENIOR PO) Take 1 tablet by mouth daily.   Yes [provider]  [facility] powder Apply 1 application topically in the morning and at bedtime. 10/22/19  Yes [provider]    Inpatient Medications:     Allergies: No Known Allergies  Social History   Socioeconomic History  . Marital status: Unknown    Spouse name: Not on file  . Number of children: Not on file  . Years of education: Not on file  . Highest education level: Not on file  Occupational History  . Not on file  Tobacco Use  . Smoking status: Never Smoker  . Smokeless tobacco: Never Used  Vaping Use  . Vaping Use: Never used  Substance and Sexual Activity  . Alcohol use: Never  . Drug use: Never  . Sexual activity: Not Currently  Other Topics Concern  . Not on file  Social History Narrative  . Not on file   Social Determinants of Health   Financial Resource Strain:   . Difficulty of Paying Living Expenses:   Food Insecurity:   . Worried About Programme researcher, broadcasting/film/video in the Last Year:   . Barista in the Last Year:   Transportation Needs:   . Freight forwarder (Medical):   Marland Kitchen Lack of Transportation (Non-Medical):   Physical Activity:   . Days of Exercise per Week:   . Minutes of Exercise per Session:   Stress:   . Feeling of Stress :   Social Connections:   . Frequency of Communication with Friends and Family:   . Frequency of Social Gatherings with Friends and Family:   . Attends Religious Services:   . Active Member of Clubs or Organizations:   . Attends Banker Meetings:   .  Marital Status:   Intimate Partner Violence:   . Fear of Current or Ex-Partner:   . Emotionally Abused:   Marland Kitchen Physically Abused:   . Sexually Abused:      Family History  Problem Relation Age of Onset  . Heart attack Father   . Heart attack Brother      Review of Systems Cannot assess due to dementia  Labs: No results for input(s): CKTOTAL, CKMB, TROPONINI in the last 72 hours. Lab Results  Component Value Date   WBC 9.3 11/23/2019   HGB 16.0 (H) 11/23/2019   HCT 46.7 (H) 11/23/2019   MCV 91.2 11/23/2019   PLT 183 11/23/2019    Recent Labs   Lab 11/23/19 0840  NA 139  K 3.9  CL 99  CO2 27  BUN 16  CREATININE 0.85  CALCIUM 9.6  PROT 8.7*  BILITOT 1.1  ALKPHOS 80  ALT 18  AST 24  GLUCOSE 143*   No results found for: CHOL, HDL, LDLCALC, TRIG No results found for: DDIMER  Radiology/Studies:  CT Head Wo Contrast  Result Date: 11/23/2019 CLINICAL DATA:  Larey Seat this morning.  Patient with baseline dementia. EXAM: CT HEAD WITHOUT CONTRAST TECHNIQUE: Contiguous axial images were obtained from the base of the skull through the vertex without intravenous contrast. COMPARISON:  None. FINDINGS: Brain: No evidence of acute infarction, hemorrhage, hydrocephalus, extra-axial collection or mass lesion/mass effect. Mild patchy areas of white matter hypoattenuation are noted consistent with chronic microvascular ischemic change. Vascular: No hyperdense vessel or unexpected calcification. Skull: Normal. Negative for fracture or focal lesion. Sinuses/Orbits: Visualized globes and orbits are unremarkable. The visualized sinuses and mastoid air cells are clear. Other: Small right parietal scalp hematoma. IMPRESSION: 1. No acute intracranial abnormalities. 2. Small right parietal scalp hematoma.  No skull fracture Electronically Signed   By: Amie Portland M.D.   On: 11/23/2019 10:22   DG Hip Unilat W or Wo Pelvis 2-3 Views Right  Result Date: 11/23/2019 CLINICAL DATA:  Fall with RIGHT hip deformity. EXAM: DG HIP (WITH OR WITHOUT PELVIS) 2-3V RIGHT COMPARISON:  None FINDINGS: Osteopenia with complete displacement of a RIGHT femoral neck fracture and superior migration of the distal femur relative to the femoral head. Femoral head appears located on submitted views. Degree of comminution may be present in there is sclerosis along the fracture margin some of this irregularity could be due to overlapping bone. Is and degree of osteopenia. No additional fractures. IMPRESSION: Complete displacement of a RIGHT femoral neck fracture. Superior migration of the  distal femur relative to femoral head. Character of the bone is abnormal, potentially related to the degree of osteopenia. Based on appearance pathologic fracture could also be considered. Electronically Signed   By: Donzetta Kohut M.D.   On: 11/23/2019 09:53    EKG: Normal sinus rhythm with nonspecific ST changes Rhythm strips suggest second-degree type I AV block  Weights: Filed Weights   11/23/19 0835  Weight: 88.9 kg     Physical Exam: Blood pressure (!) 149/72, pulse 66, temperature 98 F (36.7 C), temperature source Oral, resp. rate 19, height 5' 7.5" (1.715 m), weight 88.9 kg, SpO2 93 %. Body mass index is 30.24 kg/m. General: Well developed, well nourished, in no acute distress. Head eyes ears nose throat: Normocephalic, atraumatic, sclera non-icteric, no xanthomas, nares are without discharge. No apparent thyromegaly and/or mass  Lungs: Normal respiratory effort.  no wheezes, no rales, no rhonchi.  Heart: RRR with normal S1 S2. no murmur gallop, no  rub, PMI is normal size and placement, carotid upstroke normal without bruit, jugular venous pressure is normal Abdomen: Soft, non-tender, non-distended with normoactive bowel sounds. No hepatomegaly. No rebound/guarding. No obvious abdominal masses. Abdominal aorta is normal size without bruit Extremities: No edema. no cyanosis, no clubbing, no ulcers  Peripheral : 2+ bilateral upper extremity pulses, 2+ bilateral femoral pulses, 2+ bilateral dorsal pedal pulse Neuro: Is not alert and oriented. No facial asymmetry. No focal deficit. Moves all extremities spontaneously. Musculoskeletal: Normal muscle tone without kyphosis Psych: Does not responds to questions appropriately with a normal affect.    Assessment: 87 year old female with no previous cardiovascular history having bradycardia that is asymptomatic with no evidence of acute coronary syndrome congestive heart failure or syncope and EKG and strips suggesting second-degree type  I AV block.  This should be responsive to medication management including atropine and or dopamine if necessary but this is currently occurring very intermittently  Plan: 1.  Proceed to surgical intervention of hip fracture without restriction watching closely for bradycardia and or second-degree AV block which based on current evaluation should respond to medication management including atropine or dopamine 2.  No further cardiac diagnostics necessary at this time 3.  No restrictions to rehabilitation although will continue to follow for any symptomatic bradycardia  Signed, Lamar Blinks M.D. [facility] Cardiology 11/23/2019, 4:34 PM

## 2019-11-23 NOTE — Progress Notes (Signed)
[name] [phone] NOT Brother

## 2019-11-24 ENCOUNTER — Inpatient Hospital Stay: Payer: Medicare Other

## 2019-11-24 ENCOUNTER — Encounter
Admission: EM | Disposition: A | Discharge status: Skilled Nursing Facility | Payer: Self-pay | Source: Skilled Nursing Facility | Attending: Internal Medicine

## 2019-11-24 ENCOUNTER — Inpatient Hospital Stay: Payer: Medicare Other | Admitting: Anesthesiology

## 2019-11-24 DIAGNOSIS — R509 Fever, unspecified: Secondary | ICD-10-CM

## 2019-11-24 DIAGNOSIS — F0390 Unspecified dementia without behavioral disturbance: Secondary | ICD-10-CM

## 2019-11-24 HISTORY — PX: HIP ARTHROPLASTY: SHX981

## 2019-11-24 LAB — BASIC METABOLIC PANEL
Anion gap: 11 (ref 5–15)
BUN: 23 mg/dL (ref 8–23)
CO2: 24 mmol/L (ref 22–32)
Calcium: 8.7 mg/dL — ABNORMAL LOW (ref 8.9–10.3)
Chloride: 102 mmol/L (ref 98–111)
Creatinine, Ser: 1.05 mg/dL — ABNORMAL HIGH (ref 0.44–1.00)
GFR calc Af Amer: 55 mL/min — ABNORMAL LOW (ref >60)
GFR calc non Af Amer: 48 mL/min — ABNORMAL LOW (ref >60)
Glucose, Bld: 185 mg/dL — ABNORMAL HIGH (ref 70–99)
Potassium: 4 mmol/L (ref 3.5–5.1)
Sodium: 137 mmol/L (ref 135–145)

## 2019-11-24 LAB — CBC
HCT: 42.8 % (ref 36.0–46.0)
Hemoglobin: 14.7 g/dL (ref 12.0–15.0)
MCH: 31.3 pg (ref 26.0–34.0)
MCHC: 34.3 g/dL (ref 30.0–36.0)
MCV: 91.3 fL (ref 80.0–100.0)
Platelets: 183 10*3/uL (ref 150–400)
RBC: 4.69 MIL/uL (ref 3.87–5.11)
RDW: 12.9 % (ref 11.5–15.5)
WBC: 12.7 10*3/uL — ABNORMAL HIGH (ref 4.0–10.5)
nRBC: 0 % (ref 0.0–0.2)

## 2019-11-24 LAB — URINALYSIS, ROUTINE W REFLEX MICROSCOPIC
Bilirubin Urine: NEGATIVE
Glucose, UA: NEGATIVE mg/dL
Hgb urine dipstick: NEGATIVE
Ketones, ur: NEGATIVE mg/dL
Leukocytes,Ua: NEGATIVE
Nitrite: NEGATIVE
Protein, ur: NEGATIVE mg/dL
Specific Gravity, Urine: 1 — ABNORMAL LOW (ref 1.005–1.030)
pH: 5 (ref 5.0–8.0)

## 2019-11-24 LAB — CBC WITH DIFFERENTIAL/PLATELET
Abs Immature Granulocytes: 0.09 10*3/uL — ABNORMAL HIGH (ref 0.00–0.07)
Basophils Absolute: 0.1 10*3/uL (ref 0.0–0.1)
Basophils Relative: 0 %
Eosinophils Absolute: 0.2 10*3/uL (ref 0.0–0.5)
Eosinophils Relative: 2 %
HCT: 38.2 % (ref 36.0–46.0)
Hemoglobin: 12.9 g/dL (ref 12.0–15.0)
Immature Granulocytes: 1 %
Lymphocytes Relative: 5 %
Lymphs Abs: 0.8 10*3/uL (ref 0.7–4.0)
MCH: 31.2 pg (ref 26.0–34.0)
MCHC: 33.8 g/dL (ref 30.0–36.0)
MCV: 92.5 fL (ref 80.0–100.0)
Monocytes Absolute: 1 10*3/uL (ref 0.1–1.0)
Monocytes Relative: 7 %
Neutro Abs: 13.5 10*3/uL — ABNORMAL HIGH (ref 1.7–7.7)
Neutrophils Relative %: 85 %
Platelets: 154 10*3/uL (ref 150–400)
RBC: 4.13 MIL/uL (ref 3.87–5.11)
RDW: 13.2 % (ref 11.5–15.5)
WBC: 15.7 10*3/uL — ABNORMAL HIGH (ref 4.0–10.5)
nRBC: 0 % (ref 0.0–0.2)

## 2019-11-24 LAB — TSH: TSH: 8.556 u[IU]/mL — ABNORMAL HIGH (ref 0.350–4.500)

## 2019-11-24 LAB — T4, FREE: Free T4: 1.28 ng/dL — ABNORMAL HIGH (ref 0.61–1.12)

## 2019-11-24 LAB — LACTIC ACID, PLASMA: Lactic Acid, Venous: 1.4 mmol/L (ref 0.5–1.9)

## 2019-11-24 SURGERY — HEMIARTHROPLASTY, HIP, DIRECT ANTERIOR APPROACH, FOR FRACTURE
Anesthesia: Spinal | Site: Hip | Laterality: Right

## 2019-11-24 MED ORDER — BUPIVACAINE HCL (PF) 0.5 % IJ SOLN
INTRAMUSCULAR | Status: DC | PRN
  Administered 2019-11-24: 15 mL

## 2019-11-24 MED ORDER — METHOCARBAMOL 500 MG PO TABS
500.0000 mg | ORAL_TABLET | Freq: Three times a day (TID) | ORAL | Status: DC
  Administered 2019-11-24 – 2019-11-28 (×12): 500 mg via ORAL
  Filled 2019-11-24 (×12): qty 1

## 2019-11-24 MED ORDER — ADULT MULTIVITAMIN W/MINERALS CH
1.0000 | ORAL_TABLET | Freq: Every day | ORAL | Status: DC
  Administered 2019-11-25 – 2019-11-28 (×4): 1 via ORAL
  Filled 2019-11-24 (×4): qty 1

## 2019-11-24 MED ORDER — TRANEXAMIC ACID-NACL 1000-0.7 MG/100ML-% IV SOLN
INTRAVENOUS | Status: AC
  Filled 2019-11-24: qty 100

## 2019-11-24 MED ORDER — SODIUM CHLORIDE 0.9 % IV SOLN: INTRAVENOUS | Status: DC

## 2019-11-24 MED ORDER — PROPOFOL 500 MG/50ML IV EMUL
INTRAVENOUS | Status: AC
  Filled 2019-11-24: qty 50

## 2019-11-24 MED ORDER — BUPIVACAINE HCL (PF) 0.5 % IJ SOLN
INTRAMUSCULAR | Status: AC
  Filled 2019-11-24: qty 10

## 2019-11-24 MED ORDER — BUPIVACAINE HCL (PF) 0.5 % IJ SOLN
INTRAMUSCULAR | Status: AC
  Filled 2019-11-24: qty 30

## 2019-11-24 MED ORDER — LACTATED RINGERS IV SOLN
INTRAVENOUS | Status: DC | PRN
Start: 2019-11-24 — End: 2019-11-24

## 2019-11-24 MED ORDER — NEOMYCIN-POLYMYXIN B GU 40-200000 IR SOLN
Status: AC
  Filled 2019-11-24: qty 20

## 2019-11-24 MED ORDER — ONDANSETRON HCL 4 MG/2ML IJ SOLN: 4.0000 mg | Freq: Once | INTRAMUSCULAR | Status: DC | PRN

## 2019-11-24 MED ORDER — FENTANYL CITRATE (PF) 100 MCG/2ML IJ SOLN: 25.0000 ug | INTRAMUSCULAR | Status: DC | PRN

## 2019-11-24 MED ORDER — TRANEXAMIC ACID-NACL 1000-0.7 MG/100ML-% IV SOLN
INTRAVENOUS | Status: DC | PRN
  Administered 2019-11-24: 1000 mg via INTRAVENOUS

## 2019-11-24 MED ORDER — KETOROLAC TROMETHAMINE 30 MG/ML IJ SOLN
INTRAMUSCULAR | Status: AC
  Filled 2019-11-24: qty 1

## 2019-11-24 MED ORDER — MORPHINE SULFATE (PF) 2 MG/ML IV SOLN
0.5000 mg | INTRAVENOUS | Status: DC | PRN
  Administered 2019-11-24: 0.5 mg via INTRAVENOUS
  Filled 2019-11-24: qty 1

## 2019-11-24 MED ORDER — TRANEXAMIC ACID 1000 MG/10ML IV SOLN
INTRAVENOUS | Status: AC
  Filled 2019-11-24: qty 10

## 2019-11-24 MED ORDER — CHLORHEXIDINE GLUCONATE CLOTH 2 % EX PADS: 6.0000 | MEDICATED_PAD | Freq: Every day | CUTANEOUS | Status: DC

## 2019-11-24 MED ORDER — SODIUM CHLORIDE (PF) 0.9 % IJ SOLN
INTRAMUSCULAR | Status: AC
  Filled 2019-11-24: qty 50

## 2019-11-24 MED ORDER — KETAMINE HCL 50 MG/ML IJ SOLN
INTRAMUSCULAR | Status: AC
  Filled 2019-11-24: qty 10

## 2019-11-24 MED ORDER — BUPIVACAINE LIPOSOME 1.3 % IJ SUSP
INTRAMUSCULAR | Status: DC | PRN
  Administered 2019-11-24: 50 mL

## 2019-11-24 MED ORDER — PROPOFOL 10 MG/ML IV BOLUS
INTRAVENOUS | Status: AC
  Filled 2019-11-24: qty 20

## 2019-11-24 MED ORDER — PROPOFOL 500 MG/50ML IV EMUL
INTRAVENOUS | Status: DC | PRN
  Administered 2019-11-24: 30 mg via INTRAVENOUS
  Administered 2019-11-24: 50 ug/kg/min via INTRAVENOUS

## 2019-11-24 MED ORDER — ENSURE ENLIVE PO LIQD
237.0000 mL | Freq: Two times a day (BID) | ORAL | Status: DC
  Administered 2019-11-25 – 2019-11-27 (×6): 237 mL via ORAL
  Filled 2019-11-24 (×2): qty 237

## 2019-11-24 MED ORDER — OXYCODONE HCL 5 MG PO TABS
5.0000 mg | ORAL_TABLET | ORAL | Status: DC | PRN
  Administered 2019-11-24 – 2019-11-28 (×8): 5 mg via ORAL
  Filled 2019-11-24 (×6): qty 1

## 2019-11-24 MED ORDER — EPHEDRINE 5 MG/ML INJ
INTRAVENOUS | Status: AC
  Filled 2019-11-24: qty 10

## 2019-11-24 MED ORDER — ACETAMINOPHEN 500 MG PO TABS
1000.0000 mg | ORAL_TABLET | Freq: Three times a day (TID) | ORAL | Status: DC
  Administered 2019-11-24 – 2019-11-28 (×13): 1000 mg via ORAL
  Filled 2019-11-24 (×13): qty 2

## 2019-11-24 MED ORDER — KETAMINE HCL 50 MG/ML IJ SOLN
INTRAMUSCULAR | Status: DC | PRN
Start: 2019-11-24 — End: 2019-11-24
  Administered 2019-11-24 (×2): 25 mg via INTRAMUSCULAR
  Administered 2019-11-24: 50 mg via INTRAMUSCULAR

## 2019-11-24 MED ORDER — EPHEDRINE SULFATE 50 MG/ML IJ SOLN
INTRAMUSCULAR | Status: DC | PRN
  Administered 2019-11-24 (×2): 10 mg via INTRAVENOUS
  Administered 2019-11-24 (×2): 15 mg via INTRAVENOUS

## 2019-11-24 MED ORDER — BUPIVACAINE LIPOSOME 1.3 % IJ SUSP
INTRAMUSCULAR | Status: AC
  Filled 2019-11-24: qty 20

## 2019-11-24 MED ORDER — TRANEXAMIC ACID-NACL 1000-0.7 MG/100ML-% IV SOLN
1000.0000 mg | INTRAVENOUS | Status: AC
  Administered 2019-11-24: 1000 mg via INTRAVENOUS

## 2019-11-24 MED ORDER — MORPHINE SULFATE (PF) 2 MG/ML IV SOLN
0.5000 mg | INTRAVENOUS | Status: DC | PRN
  Administered 2019-11-25: 0.5 mg via INTRAVENOUS
  Filled 2019-11-24: qty 1

## 2019-11-24 MED ORDER — SODIUM CHLORIDE 0.9 % IV SOLN
INTRAVENOUS | Status: DC | PRN
  Administered 2019-11-24: 50 ug/min via INTRAVENOUS

## 2019-11-24 SURGICAL SUPPLY — 78 items
BLADE SAW SGTL 13X75X1.27 (BLADE) ×3 IMPLANT
BLADE SURG SZ10 CARB STEEL (BLADE) ×3 IMPLANT
BNDG COHESIVE 4X5 TAN STRL (GAUZE/BANDAGES/DRESSINGS) ×3 IMPLANT
BONE CEMENT GENTAMICIN (Cement) ×6 IMPLANT
BOWL CEMENT MIXING ADV NOZZLE (MISCELLANEOUS) ×2 IMPLANT
CANISTER SUCT 1200ML W/VALVE (MISCELLANEOUS) ×3 IMPLANT
CANISTER SUCT 3000ML PPV (MISCELLANEOUS) ×4 IMPLANT
CEMENT BONE GENTAMICIN 40 (Cement) IMPLANT
CEMENT RESTRICTOR DEPUY SZ 3 (Cement) ×2 IMPLANT
CHLORAPREP W/TINT 26 (MISCELLANEOUS) ×3 IMPLANT
COVER BACK TABLE REUSABLE LG (DRAPES) ×3 IMPLANT
COVER MAYO STAND STRL (DRAPES) ×3 IMPLANT
COVER WAND RF STERILE (DRAPES) ×1 IMPLANT
DERMABOND ADVANCED (GAUZE/BANDAGES/DRESSINGS) ×2
DERMABOND ADVANCED .7 DNX12 (GAUZE/BANDAGES/DRESSINGS) ×1 IMPLANT
DRAPE 3/4 80X56 (DRAPES) ×9 IMPLANT
DRAPE INCISE IOBAN 66X60 STRL (DRAPES) ×3 IMPLANT
DRAPE SPLIT 6X30 W/TAPE (DRAPES) ×3 IMPLANT
DRAPE SURG 17X11 SM STRL (DRAPES) ×3 IMPLANT
DRAPE U-SHAPE 47X51 STRL (DRAPES) ×5 IMPLANT
DRSG OPSITE POSTOP 4X10 (GAUZE/BANDAGES/DRESSINGS) ×3 IMPLANT
DRSG OPSITE POSTOP 4X8 (GAUZE/BANDAGES/DRESSINGS) ×3 IMPLANT
ELECT BLADE 6.5 EXT (BLADE) ×3 IMPLANT
ELECT CAUTERY BLADE 6.4 (BLADE) ×3 IMPLANT
ELECT REM PT RETURN 9FT ADLT (ELECTROSURGICAL) ×3
ELECTRODE REM PT RTRN 9FT ADLT (ELECTROSURGICAL) ×1 IMPLANT
GAUZE SPONGE 4X4 12PLY STRL (GAUZE/BANDAGES/DRESSINGS) ×1 IMPLANT
GAUZE XEROFORM 1X8 LF (GAUZE/BANDAGES/DRESSINGS) ×3 IMPLANT
GLOVE BIOGEL PI IND STRL 8 (GLOVE) ×2 IMPLANT
GLOVE BIOGEL PI INDICATOR 8 (GLOVE) ×6
GLOVE SURG ORTHO 8.0 STRL STRW (GLOVE) ×10 IMPLANT
GOWN STRL REUS W/ TWL LRG LVL3 (GOWN DISPOSABLE) ×1 IMPLANT
GOWN STRL REUS W/ TWL XL LVL3 (GOWN DISPOSABLE) ×1 IMPLANT
GOWN STRL REUS W/TWL LRG LVL3 (GOWN DISPOSABLE) ×4
GOWN STRL REUS W/TWL XL LVL3 (GOWN DISPOSABLE) ×2
HEAD MODULAR ENDO (Orthopedic Implant) ×2 IMPLANT
HEAD UNPLR 51XMDLR STRL HIP (Orthopedic Implant) IMPLANT
HEMOVAC 400ML (MISCELLANEOUS)
KIT DRAIN HEMOVAC JP 7FR 400ML (MISCELLANEOUS) IMPLANT
KIT TURNOVER KIT A (KITS) ×3 IMPLANT
NDL FILTER BLUNT 18X1 1/2 (NEEDLE) ×1 IMPLANT
NDL MAYO CATGUT SZ4 TPR NDL (NEEDLE) ×1 IMPLANT
NDL SAFETY ECLIPSE 18X1.5 (NEEDLE) ×1 IMPLANT
NEEDLE FILTER BLUNT 18X 1/2SAF (NEEDLE) ×2
NEEDLE FILTER BLUNT 18X1 1/2 (NEEDLE) ×1 IMPLANT
NEEDLE HYPO 18GX1.5 SHARP (NEEDLE) ×2
NEEDLE HYPO 22GX1.5 SAFETY (NEEDLE) ×3 IMPLANT
NEEDLE MAYO CATGUT SZ4 (NEEDLE) ×3 IMPLANT
NEPTUNE MANIFOLD (MISCELLANEOUS) ×3 IMPLANT
NS IRRIG 1000ML POUR BTL (IV SOLUTION) ×3 IMPLANT
PACK HIP PROSTHESIS (MISCELLANEOUS) ×3 IMPLANT
PENCIL SMOKE ULTRAEVAC 22 CON (MISCELLANEOUS) ×3 IMPLANT
PILLOW ABDUCTION FOAM SM (MISCELLANEOUS) ×3 IMPLANT
PRESSURIZER FEM CANAL M (MISCELLANEOUS) ×2 IMPLANT
PULSAVAC PLUS IRRIG FAN TIP (DISPOSABLE) ×3
RETRIEVER SUT HEWSON (MISCELLANEOUS) IMPLANT
SLEEVE UNITRAX V40 (Orthopedic Implant) ×2 IMPLANT
SLEEVE UNITRAX V40 +4 (Orthopedic Implant) IMPLANT
SOL .9 NS 3000ML IRR  AL (IV SOLUTION) ×2
SOL .9 NS 3000ML IRR UROMATIC (IV SOLUTION) ×1 IMPLANT
SPACER OSTEO CEMENT (Orthopedic Implant) ×2 IMPLANT
SPACER OSTEO CEMENT 14 HIP (Orthopedic Implant) IMPLANT
STAPLER SKIN PROX 35W (STAPLE) ×3 IMPLANT
STEM HIP ACCOLADE SZ5 37X145 (Stem) ×2 IMPLANT
SUT ETHIBOND #5 BRAIDED 30INL (SUTURE) ×3 IMPLANT
SUT MNCRL 4-0 (SUTURE) ×2
SUT MNCRL 4-0 27XMFL (SUTURE) ×1
SUT VIC AB 0 CT1 36 (SUTURE) ×3 IMPLANT
SUT VIC AB 2-0 CT2 27 (SUTURE) ×6 IMPLANT
SUTURE MNCRL 4-0 27XMF (SUTURE) ×1 IMPLANT
SYR 20ML LL LF (SYRINGE) ×3 IMPLANT
SYR 50ML LL SCALE MARK (SYRINGE) ×3 IMPLANT
TAPE MICROFOAM 4IN (TAPE) ×1 IMPLANT
TAPE TRANSPORE STRL 2 31045 (GAUZE/BANDAGES/DRESSINGS) ×3 IMPLANT
TIP BRUSH PULSAVAC PLUS 24.33 (MISCELLANEOUS) ×3 IMPLANT
TIP FAN IRRIG PULSAVAC PLUS (DISPOSABLE) ×1 IMPLANT
TUBE KAMVAC SUCTION (TUBING) ×3 IMPLANT
TUBE SUCT KAM VAC (TUBING) ×3 IMPLANT

## 2019-11-24 NOTE — Progress Notes (Deleted)
Patient has a MEWS score of Red while in recovery from surgery for elevated systolic BP and HR in 40s. Upon return to floor vitals were checked by this RN and MEWS score was green

## 2019-11-24 NOTE — Progress Notes (Addendum)
PROGRESS NOTE    Jane Bailey   TJQ:[id number]  DOB: 10/07/1930  PCP: Drue Flirt, MD    DOA: 11/23/2019 LOS: 1   Brief Narrative   Jane Bailey is a 86 y.o. female with medical history significant of dementia, who presented to the ED on 11/23/19 from SNF after a fall and right hip pain. No loss of consciousness, per history was a mechanical fall, did strike right side of head, had mild headache on arrival, no neck pain.    In the ED, bradycardic with HR as low as the 30's to 40-60's.  EKG showed Mobitz type I AV block.  Afebrile, tachypneic, with otherwise normal vitals.  Labs notable for no leukocytosis, negative Covid-19 PCR, normal renal function and electrolytes, Hbg 16.0.  CT head negative for acute findings, showed a small right parietal scalp hematoma.    X-ray of Right hip/pelvis showed a completely displaced RIGHT femoral neck fracture, likely osteopenia.     Admitted to hospitalist service with Orthopedist on board.  Cardiology was consulted and cleared patient for surgery.        Assessment & Plan   Principal Problem:   Closed displaced fracture of right femoral neck (HCC) Active Problems:   Fall   Dementia (HCC)   Bradycardia   Closed displaced fracture of right femoral neck -status post mechanical fall and confirmed on x-ray.  Patient was taken to surgery with Dr. Allena Katz today (8/1).  Pain control with scheduled Tylenol and Robaxin, as needed oxycodone and IV morphine for breakthrough.  Zofran as needed.  PT evaluation likely tomorrow.    Dementia without behavioral disturbance -not on medications.  Monitor for worsening confusion or agitation.  Sinus bradycardia -chronic with heart rates 40s to 60s, but occasionally down in 30s.  Cardiology was consulted and cleared patient for surgery.  EKG on admission showed possible Mobitz type I AV block.  TSH is mildly elevated at 8.556.  Free T4 is pending.  Avoid AV nodal blockers.   Patient BMI: Body  mass index is 23.76 kg/m.   DVT prophylaxis: SCDs Start: 11/23/19 1159   Diet:  Diet Orders (From admission, onward)    Start     Ordered   11/24/19 0001  Diet NPO time specified Except for: [employer], Sips with Meds  Diet effective midnight       Question Answer Comment  Except for Ice Chips   Except for Sips with Meds      11/23/19 2011            Code Status: Full Code    Subjective 11/24/19    Patient seen after surgery with son and daughter at bedside.  She reported not having hip pain but is having some low back pain.  She is mildly confused especially to place and situation.  She denies chest pain or shortness of breath, fevers or chills, dysuria, nausea vomiting or any other complaints.   Disposition Plan & Communication   Status is: Inpatient  Remains inpatient appropriate because:Inpatient level of care appropriate due to severity of illness.  Patient had surgery for hip fracture repair today, will require PT evaluation for disposition.   Dispo: The patient is from: SNF              Anticipated d/c is to: SNF               Anticipated d/c date is: 2 days  Patient currently is not medically stable to d/c.        Family Communication: Son and daughter at bedside during encounter   Consults, Procedures, Significant Events   Consultants:   Orthopedics, Dr. Allena Katz  Procedures:   8/1: Right hip hemiarthroplasty  Antimicrobials:   None   Objective   Vitals:   11/23/19 1741 11/23/19 2006 11/24/19 0307 11/24/19 0727  BP: (!) 144/83 (!) 141/52 (!) 114/47 (!) 127/48  Pulse: 72 (!) 39 (!) 43 (!) 44  Resp: 19 18 19 15   Temp: 98.6 F (37 C) (!) 100.7 F (38.2 C) 99.5 F (37.5 C) 98.2 F (36.8 C)  TempSrc: Oral Oral Oral Oral  SpO2: 95% 93% 95% 90%  Weight:  66.8 kg    Height:  5\' 6"  (1.676 m)      Intake/Output Summary (Last 24 hours) at 11/24/2019 0807 Last data filed at 11/24/2019 0729 Gross per 24 hour  Intake --  Output 725 ml   Net -725 ml   Filed Weights   11/23/19 0835 11/23/19 2006  Weight: 88.9 kg 66.8 kg    Physical Exam:  General exam: awake, alert, no acute distress Respiratory system: CTAB, no wheezes, rales or rhonchi, normal respiratory effort. Cardiovascular system: normal S1/S2, RRR, no pedal edema.   Gastrointestinal system: soft, NT, ND, no HSM felt, +bowel sounds. Central nervous system: alert, disoriented to place and situation. no gross focal neurologic deficits, normal speech Extremities: Right lateral hip with honeycomb dressing intact over surgical site, hands and feet with hypertrophic joints consistent with arthritis Psychiatry: normal mood, congruent affect, abnormal judgement and insight due to dementia  Labs   Data Reviewed: I have personally reviewed following labs and imaging studies  CBC: Recent Labs  Lab 11/23/19 0840 11/24/19 0428  WBC 9.3 12.7*  HGB 16.0* 14.7  HCT 46.7* 42.8  MCV 91.2 91.3  PLT 183 183   Basic Metabolic Panel: Recent Labs  Lab 11/23/19 0840 11/24/19 0428  NA 139 137  K 3.9 4.0  CL 99 102  CO2 27 24  GLUCOSE 143* 185*  BUN 16 23  CREATININE 0.85 1.05*  CALCIUM 9.6 8.7*   GFR: Estimated Creatinine Clearance: 35.3 mL/min (A) (by C-G formula based on SCr of 1.05 mg/dL (H)). Liver Function Tests: Recent Labs  Lab 11/23/19 0840  AST 24  ALT 18  ALKPHOS 80  BILITOT 1.1  PROT 8.7*  ALBUMIN 4.4   No results for input(s): LIPASE, AMYLASE in the last 168 hours. No results for input(s): AMMONIA in the last 168 hours. Coagulation Profile: Recent Labs  Lab 11/23/19 0840  INR 1.0   Cardiac Enzymes: No results for input(s): CKTOTAL, CKMB, CKMBINDEX, TROPONINI in the last 168 hours. BNP (last 3 results) No results for input(s): PROBNP in the last 8760 hours. HbA1C: No results for input(s): HGBA1C in the last 72 hours. CBG: No results for input(s): GLUCAP in the last 168 hours. Lipid Profile: No results for input(s): CHOL, HDL,  LDLCALC, TRIG, CHOLHDL, LDLDIRECT in the last 72 hours. Thyroid Function Tests: Recent Labs    11/24/19 0428  TSH 8.556*   Anemia Panel: No results for input(s): VITAMINB12, FOLATE, FERRITIN, TIBC, IRON, RETICCTPCT in the last 72 hours. Sepsis Labs: Recent Labs  Lab 11/24/19 0428  LATICACIDVEN 1.4    Recent Results (from the past 240 hour(s))  SARS Coronavirus 2 by RT PCR (hospital order, performed in [facility] hospital lab) Nasopharyngeal Nasopharyngeal Swab     Status: None  Collection Time: 11/23/19  8:41 AM   Specimen: Nasopharyngeal Swab  Result Value Ref Range Status   SARS Coronavirus 2 NEGATIVE NEGATIVE Final    Comment: (NOTE) SARS-CoV-2 target nucleic acids are NOT DETECTED.  The SARS-CoV-2 RNA is generally detectable in upper and lower respiratory specimens during the acute phase of infection. The lowest concentration of SARS-CoV-2 viral copies this assay can detect is 250 copies / mL. A negative result does not preclude SARS-CoV-2 infection and should not be used as the sole basis for treatment or other patient management decisions.  A negative result may occur with improper specimen collection / handling, submission of specimen other than nasopharyngeal swab, presence of viral mutation(s) within the areas targeted by this assay, and inadequate number of viral copies (<250 copies / mL). A negative result must be combined with clinical observations, patient history, and epidemiological information.  Fact Sheet for Patients:   BoilerBrush.com.cy  Fact Sheet for Healthcare Providers: https://pope.com/  This test is not yet approved or  cleared by the [place] FDA and has been authorized for detection and/or diagnosis of SARS-CoV-2 by FDA under an Emergency Use Authorization (EUA).  This EUA will remain in effect (meaning this test can be used) for the duration of the COVID-19 declaration under Section  564(b)(1) of the Act, 21 U.S.C. section 360bbb-3(b)(1), unless the authorization is terminated or revoked sooner.  Performed at [facility], [address] [place], [place] [address]   MRSA PCR Screening     Status: None   Collection Time: 11/23/19  7:38 PM   Specimen: Nasopharyngeal  Result Value Ref Range Status   MRSA by PCR NEGATIVE NEGATIVE Final    Comment:        The GeneXpert MRSA Assay (FDA approved for NASAL specimens only), is one component of a comprehensive MRSA colonization surveillance program. It is not intended to diagnose MRSA infection nor to guide or monitor treatment for MRSA infections. Performed at [facility], [address] [place], [place] [address]   CULTURE, BLOOD (ROUTINE X 2) w Reflex to ID Panel     Status: None (Preliminary result)   Collection Time: 11/24/19  4:28 AM   Specimen: BLOOD  Result Value Ref Range Status   Specimen Description BLOOD RIGHT [facility]  Final   Special Requests   Final    BOTTLES DRAWN AEROBIC AND ANAEROBIC Blood Culture adequate volume   Culture   Final    NO GROWTH < 12 HOURS Performed at [facility], [address], [place], [place] [address]    Report Status PENDING  Incomplete  CULTURE, BLOOD (ROUTINE X 2) w Reflex to ID Panel     Status: None (Preliminary result)   Collection Time: 11/24/19  4:30 AM   Specimen: BLOOD  Result Value Ref Range Status   Specimen Description BLOOD LEFT AC  Final   Special Requests   Final    BOTTLES DRAWN AEROBIC AND ANAEROBIC Blood Culture adequate volume   Culture   Final    NO GROWTH < 12 HOURS Performed at [facility], [address], [place], [place] [address]    Report Status PENDING  Incomplete      Imaging Studies   CT Head Wo Contrast  Result Date: 11/23/2019 CLINICAL DATA:  Larey Seat this morning.  Patient with baseline dementia. EXAM: CT HEAD WITHOUT CONTRAST TECHNIQUE: Contiguous axial images were obtained from the base of the  skull through the vertex without intravenous contrast. COMPARISON:  None. FINDINGS: Brain: No evidence of acute infarction, hemorrhage, hydrocephalus, extra-axial collection or mass lesion/mass effect. Mild patchy areas of white matter hypoattenuation are noted consistent with chronic microvascular ischemic change. Vascular: No hyperdense vessel or unexpected calcification. Skull: Normal. Negative for fracture or focal lesion. Sinuses/Orbits: Visualized globes and orbits are unremarkable. The visualized sinuses and mastoid air cells are clear. Other: Small right parietal scalp hematoma. IMPRESSION: 1. No acute intracranial abnormalities. 2. Small right parietal scalp hematoma.  No skull fracture Electronically Signed   By: Amie Portlandavid  Ormond M.D.   On: 11/23/2019 10:22   DG Chest Port 1 View  Result Date: 11/24/2019 CLINICAL DATA:  Fevers EXAM: PORTABLE CHEST 1 VIEW COMPARISON:  None. FINDINGS: Cardiac shadows within normal limits. Aortic calcifications are noted. The lungs are well aerated without focal infiltrate, effusion or pneumothorax. Bronchitic markings are noted likely of a chronic nature. No focal confluent infiltrate is seen. Bony structures are within normal limits. IMPRESSION: Mild bronchitic changes without focal confluent infiltrate. Electronically Signed   By: Alcide CleverMark  Lukens M.D.   On: 11/24/2019 04:00   DG Hip Unilat W or Wo Pelvis 2-3 Views Right  Result Date: 11/23/2019 CLINICAL DATA:  Fall with RIGHT hip deformity. EXAM: DG HIP (WITH OR WITHOUT PELVIS) 2-3V RIGHT COMPARISON:  None FINDINGS: Osteopenia with complete displacement of a RIGHT femoral neck fracture and superior migration of the distal femur relative to the femoral head. Femoral head appears located on submitted views. Degree of comminution may be present in there is sclerosis along the fracture margin some of this irregularity could be due to overlapping bone. Is and degree of osteopenia. No additional fractures. IMPRESSION: Complete  displacement of a RIGHT femoral neck fracture. Superior migration of the distal femur relative to femoral head. Character of the bone is abnormal, potentially related to the degree of osteopenia. Based on appearance pathologic fracture could also be considered. Electronically Signed   By: Donzetta KohutGeoffrey  Wile M.D.   On: 11/23/2019 09:53     Medications   Scheduled Meds: . Chlorhexidine Gluconate Cloth  6 each Topical Daily   Continuous Infusions: .  ceFAZolin (ANCEF) IV         LOS: 1 day    Time spent: 25 minutes    Pennie BanterKelly A Taimi Towe, DO [facility]  11/24/2019, 8:07 AM    If 7PM-7AM, please contact night-coverage. How to contact the [facility] Attending or Consulting provider 7A - 7P or covering provider during after hours 7P -7A, for this patient?    1. Check the care team in [facility] and look for a) attending/consulting [facility] provider listed and b) the [facility] team listed 2. Log into [email or web address] and use [place] [place] universal password to access. If you do not have the password, please contact the hospital operator. 3. Locate the [facility] provider you are looking for under [facility] and page to a number that you can be directly reached. 4. If you still have difficulty reaching the provider, please page the [facility] (Director on Call) for the Hospitalists listed on amion for assistance.

## 2019-11-24 NOTE — Progress Notes (Signed)
   11/24/19 1335  Vitals  BP (!) 130/55  MAP (mmHg) 76  BP Location Right Arm  BP Method Automatic  Patient Position (if appropriate) Lying  Pulse Rate (!) 44  Pulse Rate Source Monitor  Resp 20  MEWS COLOR  MEWS Score Color Green  Oxygen Therapy  SpO2 92 %  O2 Device Nasal Cannula  O2 Flow Rate (L/min) 3 L/min  MEWS Score  MEWS Temp 0  MEWS Systolic 0  MEWS Pulse 1  MEWS RR 0  MEWS LOC 0  MEWS Score 1  Patient has a MEWS score of Red while in recovery from surgery for elevated systolic BP and HR in 40s. Upon return to floor vitals were checked by this RN and MEWS score was green

## 2019-11-24 NOTE — Progress Notes (Signed)
[facility] Cardiology [facility] Encounter Note  Patient: Jane Bailey / Admit Date: 11/23/2019 / Date of Encounter: 11/24/2019, 6:34 AM   Subjective: Patient still having significant right leg pain most consistent with her fall and fracture.  No evidence of congestive heart failure angina and/or acute coronary syndrome at this time.  Telemetry does show some periods of sinus bradycardia as well as rare episodes of second-degree type I AV block asymptomatic.  This is similar to seen in the emergency room and currently appears not to be significant or concerning.  Review of Systems: Positive for: Hip pain Negative for: Vision change, hearing change, syncope, dizziness, nausea, vomiting,diarrhea, bloody stool, stomach pain, cough, congestion, diaphoresis, urinary frequency, urinary pain,skin lesions, skin rashes Others previously listed  Objective: Telemetry: Sinus bradycardia Physical Exam: Blood pressure (!) 114/47, pulse (!) 43, temperature 99.5 F (37.5 C), temperature source Oral, resp. rate 19, height 5\' 6"  (1.676 m), weight 66.8 kg, SpO2 95 %. Body mass index is 23.76 kg/m. General: Well developed, well nourished, in no acute distress. Head: Normocephalic, atraumatic, sclera non-icteric, no xanthomas, nares are without discharge. Neck: No apparent masses Lungs: Normal respirations with no wheezes, no rhonchi, no rales , no crackles   Heart: Regular rate and rhythm, normal S1 S2, no murmur, no rub, no gallop, PMI is normal size and placement, carotid upstroke normal without bruit, jugular venous pressure normal Abdomen: Soft, non-tender, non-distended with normoactive bowel sounds. No hepatosplenomegaly. Abdominal aorta is normal size without bruit Extremities: No edema, no clubbing, no cyanosis, no ulcers,  Peripheral: 2+ radial, 2+ femoral, 2+ dorsal pedal pulses Neuro: Alert and oriented. Moves all extremities spontaneously. Psych:  Responds to questions appropriately with a  normal affect.  No intake or output data in the 24 hours ending 11/24/19 0634  Inpatient Medications:  Infusions:  .  ceFAZolin (ANCEF) IV      Labs: Recent Labs    11/23/19 0840 11/24/19 0428  NA 139 137  K 3.9 4.0  CL 99 102  CO2 27 24  GLUCOSE 143* 185*  BUN 16 23  CREATININE 0.85 1.05*  CALCIUM 9.6 8.7*   Recent Labs    11/23/19 0840  AST 24  ALT 18  ALKPHOS 80  BILITOT 1.1  PROT 8.7*  ALBUMIN 4.4   Recent Labs    11/23/19 0840 11/24/19 0428  WBC 9.3 12.7*  HGB 16.0* 14.7  HCT 46.7* 42.8  MCV 91.2 91.3  PLT 183 183   No results for input(s): CKTOTAL, CKMB, TROPONINI in the last 72 hours. Invalid input(s): POCBNP No results for input(s): HGBA1C in the last 72 hours.   Weights: Filed Weights   11/23/19 0835 11/23/19 2006  Weight: 88.9 kg 66.8 kg     Radiology/Studies:  CT Head Wo Contrast  Result Date: 11/23/2019 CLINICAL DATA:  11/25/2019 this morning.  Patient with baseline dementia. EXAM: CT HEAD WITHOUT CONTRAST TECHNIQUE: Contiguous axial images were obtained from the base of the skull through the vertex without intravenous contrast. COMPARISON:  None. FINDINGS: Brain: No evidence of acute infarction, hemorrhage, hydrocephalus, extra-axial collection or mass lesion/mass effect. Mild patchy areas of white matter hypoattenuation are noted consistent with chronic microvascular ischemic change. Vascular: No hyperdense vessel or unexpected calcification. Skull: Normal. Negative for fracture or focal lesion. Sinuses/Orbits: Visualized globes and orbits are unremarkable. The visualized sinuses and mastoid air cells are clear. Other: Small right parietal scalp hematoma. IMPRESSION: 1. No acute intracranial abnormalities. 2. Small right parietal scalp hematoma.  No skull  fracture Electronically Signed   By: Amie Portland M.D.   On: 11/23/2019 10:22   DG Chest Port 1 View  Result Date: 11/24/2019 CLINICAL DATA:  Fevers EXAM: PORTABLE CHEST 1 VIEW COMPARISON:  None.  FINDINGS: Cardiac shadows within normal limits. Aortic calcifications are noted. The lungs are well aerated without focal infiltrate, effusion or pneumothorax. Bronchitic markings are noted likely of a chronic nature. No focal confluent infiltrate is seen. Bony structures are within normal limits. IMPRESSION: Mild bronchitic changes without focal confluent infiltrate. Electronically Signed   By: Alcide Clever M.D.   On: 11/24/2019 04:00   DG Hip Unilat W or Wo Pelvis 2-3 Views Right  Result Date: 11/23/2019 CLINICAL DATA:  Fall with RIGHT hip deformity. EXAM: DG HIP (WITH OR WITHOUT PELVIS) 2-3V RIGHT COMPARISON:  None FINDINGS: Osteopenia with complete displacement of a RIGHT femoral neck fracture and superior migration of the distal femur relative to the femoral head. Femoral head appears located on submitted views. Degree of comminution may be present in there is sclerosis along the fracture margin some of this irregularity could be due to overlapping bone. Is and degree of osteopenia. No additional fractures. IMPRESSION: Complete displacement of a RIGHT femoral neck fracture. Superior migration of the distal femur relative to femoral head. Character of the bone is abnormal, potentially related to the degree of osteopenia. Based on appearance pathologic fracture could also be considered. Electronically Signed   By: Donzetta Kohut M.D.   On: 11/23/2019 09:53     Assessment and Recommendation  86 y.o. female with no previous history of cardiovascular disease having fall and right hip fracture with sinus bradycardia and second-degree type I AV block intermittently asymptomatic and not requiring further intervention at lowest risk possible for cardiovascular complications with hip surgery 1.  Proceed to hip surgery without restriction following closely for any significant further symptoms or need for treatment and rhythm disturbance should respond to atropine and/or dopamine 2.  No further cardiac  diagnostics necessary at this time secondary to no evidence of acute coronary syndrome congestive heart failure or angina 3.  Orthopedic rehabilitation without restriction following for further rhythm disturbances requiring further intervention  Signed, Arnoldo Hooker M.D. FACC

## 2019-11-24 NOTE — Anesthesia Procedure Notes (Signed)
Spinal  Patient location during procedure: OR Start time: 11/24/2019 10:23 AM End time: 11/24/2019 10:24 AM Staffing Performed: resident/CRNA  Anesthesiologist: Martha Clan, MD Resident/CRNA: Clinton Sawyer, CRNA Preanesthetic Checklist Completed: patient identified, IV checked, site marked, risks and benefits discussed, surgical consent, monitors and equipment checked, pre-op evaluation and timeout performed Spinal Block Patient position: sitting Prep: ChloraPrep Patient monitoring: heart rate, continuous pulse ox, blood pressure and cardiac monitor Approach: midline Location: L3-4 Injection technique: single-shot Needle Needle type: Whitacre and Introducer  Needle gauge: 24 G Needle length: 9 cm Assessment Sensory level: T10 Additional Notes Negative paresthesia. Negative blood return. Positive free-flowing CSF. Expiration date of kit checked and confirmed. Patient tolerated procedure well, without complications.

## 2019-11-24 NOTE — Op Note (Addendum)
DATE OF SURGERY: 11/24/2019  PREOPERATIVE DIAGNOSIS: Right femoral neck fracture  POSTOPERATIVE DIAGNOSIS: Right femoral neck fracture  PROCEDURE: Right hip hemiarthroplasty  SURGEON: Rosealee Albee, MD  ASSISTANTS:   EBL: 150 cc  COMPONENTS:  Stryker - Accolade C Size 5 Stem Stryker - Unitrax 80mm head with +4 offset neck   INDICATIONS: Jane Bailey is a 86 y.o. female who sustained a displaced femoral neck fracture after a fall. Risks and benefits of hip hemiarthroplasty were explained to the patient and/or family. Risks include but are not limited to bleeding, infection, injury to tissues, nerves, vessels, periprosthetic infection, dislocation, limb length discrepancy and risks of anesthesia. The patient and/or family understands these risks, has completed an informed consent and wishes to proceed.   PROCEDURE:  The patient was identified in the preoperative holding area and the operative extremity was marked.  The patient was then transferred to the operating room suite and mobilized from the hospital gurney to the operating room table. Spinal anesthesia was administered without complication. The patient was then transitioned to a lateral position.  All bony prominences were padded per protocol.  An axillary roll was placed.  Careful attention was paid to the contralateral side peroneal nerve, which was free from pressure with use of appropriate padding and blankets. A time-out was performed to confirm the patient's identity and the correct laterality of surgery. The patient was then prepped and draped in the usual sterile fashion. Appropriate pre-operative antibiotics were administered. Tranexamic acid was administered preoperatively.    An incision that centered on the posterior tip of the greater trochanter with a posterior curve was made. Dissection was carried down through the subcutaneous tissue.  Careful attention was made to maintain hemostasis using electrocautery.   Dissection brought [place] to the level of the deep fascia where the gluteus maximus muscle and proximal portion of the IT band were identified.  The proximal region of the IT band was incised in linear fashion and this incision was extended proximally in a curvilinear fashion to split the gluteus maximus muscle parallel to its fibers to minimize bleeding.  This was accomplished using a combination bovie electrocautery as well as blunt dissection.  The trochanteric bursa was then visualized and dissected from anterior to posterior. A blunt homan retractor was placed beneath the abductors. The piriformis tendon and short external rotators were visualized. Bovie electrocautery was used to cut these with the capsule as one L-shaped flap. This was tagged at the corner with #5 Ethibond. At this point, the femoral neck fracture was visualized. An oscillating saw was used to make a new neck cut approximately 26mm about the lesser tuberosity with the use of a neck cut guide. The head was then freed from its remaining soft tissue attachments and measured. The head trial was then inserted into the acetabulum and sized appropriately.    We then turned our attention to preparing the femoral canal. First, a box cut was performed utilizing the box osteotome. A canal finder was inserted by hand and sequential broaching was then performed. The calcar planer was inserted onto the broach and used to smooth the calcar appropriately.  A trial stem, neutral neck, and head were inserted into the acetabulum and placed through range of motion. Intraoperative radiographs were taken to assess hardware position and leg lengths. The hip was again dislocated and the femoral trial components were removed. An appropriately sized cement restrictor was placed. The canal was irrigated with pulse lavage and dried. A cement gun was  used to place cement into the femoral canal. Cement was then pressurized. The actual stem was inserted into the femoral  canal and then driven onto the calcar. Position was held until the cement hardened. The trial head was then again inserted on the femoral component and found to be appropriate. Trial was removed and the permanent head/neck was Morse tapered onto the femoral stem and then reduced into the acetabulum.    The hip stability and length were reassessed and found to be satisfactory.  The wound was then copiously irrigated with normal saline solution. The tagged sutures of the capsule and piriformis were sewn to the gluteus medius tendon. This adequately closed the hip capsule. The IT band and gluteus maximus fascia were then closed with 0-Vicryl in a running, locked fashion. A mixture of Exparil and Sensoricaine was administered.  The subdermal layer was closed with 2-0 Vicryl in a buried interrupted fashion. Skin was approximated staples.  The wound was then dressed with Honeycomb dressing.  An abduction pillow was placed. The patient was mobilized from the lateral position back to supine on the operating room table and then awakened from general anesthesia without complication.     POSTOPERATIVE PLAN: The patient will be WBAT on operative extremity. Lovenox 40mg /day x 4 weeks to start on POD#1. Ancef x 24 hours. PT/OT on POD#1. Posterior hip precautions.

## 2019-11-24 NOTE — Progress Notes (Signed)
Initial Nutrition Assessment  RD working remotely.  DOCUMENTATION CODES:   Not applicable  INTERVENTION:   - Ensure Enlive po BID, each supplement provides 350 kcal and 20 grams of protein  - MVI with minerals daily  NUTRITION DIAGNOSIS:   Increased nutrient needs related to hip fracture, post-op healing as evidenced by estimated needs.  GOAL:   Patient will meet greater than or equal to 90% of their needs  MONITOR:   PO intake, Supplement acceptance, Diet advancement, Labs, Weight trends  REASON FOR ASSESSMENT:   Consult Assessment of nutrition requirement/status  ASSESSMENT:   86 year old female who presented on 7/31 after a fall. PMH of dementia. Pt admitted with closed displaced fracture of right femoral neck.   Noted plan for right hip hemiarthroplasty today.  Attempted to speak with pt via phone call to room; however, no answer. Pt is NPO for procedure but was on a regular diet prior. RD will order oral nutrition supplements to begin tomorrow, 11/25/19. Will also order daily MVI with minerals.  No weight history in chart. Admission weight recorded as 88.9 kg; however, this appears stated rather than measured. Measured weight of 66.8 kg obtained yesterday evening. Question whether pt has had weight loss.  Medications reviewed and include: IV abx  Labs reviewed.  NUTRITION - FOCUSED PHYSICAL EXAM:  Unable to complete at this time. RD working remotely.  Diet Order:   Diet Order            Diet NPO time specified Except for: Ice Chips, Sips with Meds  Diet effective midnight                 EDUCATION NEEDS:   Not appropriate for education at this time  Skin:  Skin Assessment: Reviewed RN Assessment (MASD to bilateral breasts)  Last BM:  11/23/19  Height:   Ht Readings from Last 1 Encounters:  11/23/19 5\' 6"  (1.676 m)    Weight:   Wt Readings from Last 1 Encounters:  11/23/19 66.8 kg    Ideal Body Weight:  59.1 kg  BMI:  Body mass index  is 23.76 kg/m.  Estimated Nutritional Needs:   Kcal:  1500-1700  Protein:  75-85 grams  Fluid:  1.5-1.7 L    11/25/19, MS, RD, LDN Inpatient Clinical Dietitian Please see [facility] for contact information.

## 2019-11-24 NOTE — Transfer of Care (Signed)
Immediate Anesthesia Transfer of Care Note  Patient: Jane Bailey  Procedure(s) Performed: ARTHROPLASTY BIPOLAR HIP (HEMIARTHROPLASTY) (Right Hip)  Patient Location: PACU  Anesthesia Type:Spinal  Level of Consciousness: sedated  Airway & Oxygen Therapy: Patient Spontanous Breathing and Patient connected to face mask oxygen  Post-op Assessment: Report given to RN and Post -op Vital signs reviewed and stable  Post vital signs: Reviewed and stable  Last Vitals:  Vitals Value Taken Time  BP 93/41 11/24/19 1217  Temp 36.6 C 11/24/19 1217  Pulse 47 11/24/19 1220  Resp 17 11/24/19 1220  SpO2 91 % 11/24/19 1220  Vitals shown include unvalidated device data.  Last Pain:  Vitals:   11/24/19 1217  TempSrc: Temporal  PainSc:          Complications: No complications documented.

## 2019-11-24 NOTE — Hospital Course (Signed)
Jane Bailey is a 86 y.o. female with medical history significant of dementia, who presented to the ED on 11/23/19 from [facility] after a fall and right hip pain. No loss of consciousness, per history was a mechanical fall, did strike right side of head, had mild headache on arrival, no neck pain.    In the ED, bradycardic with HR as low as the 30's to 40-60's.  EKG showed Mobitz type I AV block.  Afebrile, tachypneic, with otherwise normal vitals.  Labs notable for no leukocytosis, negative Covid-19 PCR, normal renal function and electrolytes, Hbg 16.0.  CT head negative for acute findings, showed a small right parietal scalp hematoma.    X-ray of Right hip/pelvis showed a completely displaced RIGHT femoral neck fracture, likely osteopenia.     Admitted to hospitalist service with Orthopedist on board.  Cardiology was consulted and cleared patient for surgery.

## 2019-11-24 NOTE — Plan of Care (Signed)
  Problem: Education: Goal: Knowledge of General Education information will improve Description: Including pain rating scale, medication(s)/side effects and non-pharmacologic comfort measures Outcome: Not Progressing   Problem: Pain Management: Goal: Pain level will decrease Outcome: Not Progressing

## 2019-11-24 NOTE — H&P (Signed)
H&P reviewed. No significant changes noted.  

## 2019-11-24 NOTE — Progress Notes (Addendum)
Patient with dementia admitted for femur fracture yesterday, developed fever overnight with no other SIRS criteria. COVID was negative. She has acute urinary retention, possibly from UTI.   Plan to check UA and CXR, culture blood and urine, check lactate.

## 2019-11-25 ENCOUNTER — Encounter: Payer: Self-pay | Admitting: Orthopedic Surgery

## 2019-11-25 DIAGNOSIS — R001 Bradycardia, unspecified: Secondary | ICD-10-CM

## 2019-11-25 LAB — BASIC METABOLIC PANEL WITH GFR
Anion gap: 10 (ref 5–15)
BUN: 24 mg/dL — ABNORMAL HIGH (ref 8–23)
CO2: 23 mmol/L (ref 22–32)
Calcium: 7.7 mg/dL — ABNORMAL LOW (ref 8.9–10.3)
Chloride: 104 mmol/L (ref 98–111)
Creatinine, Ser: 0.98 mg/dL (ref 0.44–1.00)
GFR calc Af Amer: >60 mL/min
GFR calc non Af Amer: 52 mL/min — ABNORMAL LOW
Glucose, Bld: 165 mg/dL — ABNORMAL HIGH (ref 70–99)
Potassium: 3.8 mmol/L (ref 3.5–5.1)
Sodium: 137 mmol/L (ref 135–145)

## 2019-11-25 LAB — URINE CULTURE: Culture: NO GROWTH

## 2019-11-25 LAB — MAGNESIUM: Magnesium: 1.8 mg/dL (ref 1.7–2.4)

## 2019-11-25 MED ORDER — ENOXAPARIN SODIUM 40 MG/0.4ML ~~LOC~~ SOLN
40.0000 mg | SUBCUTANEOUS | Status: DC
  Administered 2019-11-25 – 2019-11-28 (×4): 40 mg via SUBCUTANEOUS
  Filled 2019-11-25 (×4): qty 0.4

## 2019-11-25 MED ORDER — OXYCODONE HCL 5 MG PO TABS: 5.0000 mg | ORAL_TABLET | ORAL | 0 refills | Status: DC | PRN

## 2019-11-25 MED ORDER — SENNOSIDES-DOCUSATE SODIUM 8.6-50 MG PO TABS: 1.0000 | ORAL_TABLET | Freq: Every evening | ORAL | Status: DC | PRN

## 2019-11-25 MED ORDER — MENTHOL 3 MG MT LOZG
1.0000 | LOZENGE | OROMUCOSAL | Status: DC | PRN
  Filled 2019-11-25: qty 9

## 2019-11-25 MED ORDER — OXYCODONE HCL 5 MG PO TABS
2.5000 mg | ORAL_TABLET | ORAL | Status: DC | PRN
  Administered 2019-11-27 (×2): 5 mg via ORAL
  Filled 2019-11-25 (×3): qty 1

## 2019-11-25 MED ORDER — DOCUSATE SODIUM 100 MG PO CAPS
100.0000 mg | ORAL_CAPSULE | Freq: Two times a day (BID) | ORAL | Status: DC
  Administered 2019-11-25 – 2019-11-28 (×7): 100 mg via ORAL
  Filled 2019-11-25 (×7): qty 1

## 2019-11-25 MED ORDER — TRAMADOL HCL 50 MG PO TABS
50.0000 mg | ORAL_TABLET | Freq: Four times a day (QID) | ORAL | Status: DC | PRN
  Administered 2019-11-25 – 2019-11-28 (×7): 50 mg via ORAL
  Filled 2019-11-25 (×7): qty 1

## 2019-11-25 MED ORDER — ENOXAPARIN SODIUM 40 MG/0.4ML ~~LOC~~ SOLN: 40.0000 mg | SUBCUTANEOUS | 0 refills | Status: DC

## 2019-11-25 MED ORDER — HALOPERIDOL LACTATE 5 MG/ML IJ SOLN
1.0000 mg | Freq: Four times a day (QID) | INTRAMUSCULAR | Status: DC | PRN
  Administered 2019-11-25 (×2): 1 mg via INTRAVENOUS
  Filled 2019-11-25 (×3): qty 1

## 2019-11-25 MED ORDER — CEFAZOLIN SODIUM-DEXTROSE 1-4 GM/50ML-% IV SOLN
1.0000 g | Freq: Four times a day (QID) | INTRAVENOUS | Status: AC
  Administered 2019-11-25 (×3): 1 g via INTRAVENOUS
  Filled 2019-11-25 (×3): qty 50

## 2019-11-25 MED ORDER — METOCLOPRAMIDE HCL 5 MG/ML IJ SOLN: 5.0000 mg | Freq: Three times a day (TID) | INTRAMUSCULAR | Status: DC | PRN

## 2019-11-25 MED ORDER — ONDANSETRON HCL 4 MG PO TABS
4.0000 mg | ORAL_TABLET | Freq: Four times a day (QID) | ORAL | Status: DC | PRN
  Administered 2019-11-26 – 2019-11-28 (×4): 4 mg via ORAL
  Filled 2019-11-25 (×4): qty 1

## 2019-11-25 MED ORDER — OXYCODONE HCL 5 MG PO TABS
5.0000 mg | ORAL_TABLET | ORAL | Status: DC | PRN
  Filled 2019-11-25: qty 1

## 2019-11-25 MED ORDER — CHLORHEXIDINE GLUCONATE CLOTH 2 % EX PADS
6.0000 | MEDICATED_PAD | Freq: Every day | CUTANEOUS | Status: DC
  Administered 2019-11-25 – 2019-11-28 (×3): 6 via TOPICAL

## 2019-11-25 MED ORDER — PHENOL 1.4 % MT LIQD
1.0000 | OROMUCOSAL | Status: DC | PRN
  Filled 2019-11-25: qty 177

## 2019-11-25 MED ORDER — HYDROMORPHONE HCL 1 MG/ML IJ SOLN: 0.2000 mg | INTRAMUSCULAR | Status: DC | PRN

## 2019-11-25 MED ORDER — METOCLOPRAMIDE HCL 10 MG PO TABS: 5.0000 mg | ORAL_TABLET | Freq: Three times a day (TID) | ORAL | Status: DC | PRN

## 2019-11-25 MED ORDER — ONDANSETRON HCL 4 MG/2ML IJ SOLN
4.0000 mg | Freq: Four times a day (QID) | INTRAMUSCULAR | Status: DC | PRN
  Administered 2019-11-25 – 2019-11-26 (×3): 4 mg via INTRAVENOUS
  Filled 2019-11-25 (×3): qty 2

## 2019-11-25 MED ORDER — BISACODYL 10 MG RE SUPP
10.0000 mg | Freq: Every day | RECTAL | Status: DC | PRN
  Administered 2019-11-26 – 2019-11-27 (×2): 10 mg via RECTAL
  Filled 2019-11-25 (×2): qty 1

## 2019-11-25 MED ORDER — ACETAMINOPHEN 500 MG PO TABS: 1000.0000 mg | ORAL_TABLET | Freq: Three times a day (TID) | ORAL | Status: DC

## 2019-11-25 NOTE — Progress Notes (Signed)
Foley taken out as ordered. Pt tolerated well.

## 2019-11-25 NOTE — Anesthesia Postprocedure Evaluation (Signed)
Anesthesia Post Note  Patient: Jane Bailey  Procedure(s) Performed: ARTHROPLASTY BIPOLAR HIP (HEMIARTHROPLASTY) (Right Hip)  Patient location during evaluation: Nursing Unit Anesthesia Type: Spinal Level of consciousness: sedated, patient cooperative and responds to stimulation Pain management: pain level controlled Vital Signs Assessment: post-procedure vital signs reviewed and stable Respiratory status: spontaneous breathing, nonlabored ventilation and respiratory function stable Cardiovascular status: blood pressure returned to baseline and stable Postop Assessment: no headache and no backache Anesthetic complications: no   No complications documented.   Last Vitals:  Vitals:   11/24/19 1938 11/25/19 0312  BP: (!) 109/46 (!) 123/58  Pulse: 46 50  Resp: 17 18  Temp: 36.8 C 36.5 C  SpO2: 96% 95%    Last Pain:  Vitals:   11/25/19 0412  TempSrc:   PainSc: 4                  Chiropodist

## 2019-11-25 NOTE — Progress Notes (Signed)
[facility] Encounter Note  Patient: Jane DICICCO / Admit Date: 11/23/2019 / Date of Encounter: 11/25/2019, 1:00 PM   Subjective: Patient still having significant right leg pain most consistent with her fall and fracture now status post surgical intervention without apparent complication.  No evidence of congestive heart failure angina and/or acute coronary syndrome at this time.  Telemetry does show some periods of sinus bradycardia as well as rare episodes of second-degree type I AV block asymptomatic.  This is similar to seen in the emergency room and currently appears not to be significant or concerning. No current evidence of angina congestive heart failure or acute coronary syndrome symptoms Review of Systems: Positive for: Hip pain Negative for: Vision change, hearing change, syncope, dizziness, nausea, vomiting,diarrhea, bloody stool, stomach pain, cough, congestion, diaphoresis, urinary frequency, urinary pain,skin lesions, skin rashes Others previously listed  Objective: Telemetry: Sinus bradycardia Physical Exam: Blood pressure (!) 119/54, pulse 66, temperature 97.8 F (36.6 C), temperature source Oral, resp. rate 17, height 5\' 6"  (1.676 m), weight 66.5 kg, SpO2 97 %. Body mass index is 23.68 kg/m. General: Well developed, well nourished, in no acute distress. Head: Normocephalic, atraumatic, sclera non-icteric, no xanthomas, nares are without discharge. Neck: No apparent masses Lungs: Normal respirations with no wheezes, no rhonchi, no rales , no crackles   Heart: Regular rate and rhythm, normal S1 S2, no murmur, no rub, no gallop, PMI is normal size and placement, carotid upstroke normal without bruit, jugular venous pressure normal Abdomen: Soft, non-tender, non-distended with normoactive bowel sounds. No hepatosplenomegaly. Abdominal aorta is normal size without bruit Extremities: No edema, no clubbing, no cyanosis, no ulcers,  Peripheral: 2+ radial,  2+ femoral, 2+ dorsal pedal pulses Neuro: Alert and oriented. Moves all extremities spontaneously. Psych:  Responds to questions appropriately with a normal affect.   Intake/Output Summary (Last 24 hours) at 11/25/2019 1300 Last data filed at 11/25/2019 0600 Gross per 24 hour  Intake 1356.69 ml  Output 425 ml  Net 931.69 ml    Inpatient Medications:   acetaminophen  1,000 mg Oral Q8H   Chlorhexidine Gluconate Cloth  6 each Topical Daily   docusate sodium  100 mg Oral BID   enoxaparin (LOVENOX) injection  40 mg Subcutaneous Q24H   feeding supplement (ENSURE ENLIVE)  237 mL Oral BID BM   methocarbamol  500 mg Oral Q8H   multivitamin with minerals  1 tablet Oral Daily   Infusions:   sodium chloride 75 mL/hr at 11/25/19 0239    ceFAZolin (ANCEF) IV 1 g (11/25/19 0701)    Labs: Recent Labs    11/24/19 0428 11/25/19 0430  NA 137 137  K 4.0 3.8  CL 102 104  CO2 24 23  GLUCOSE 185* 165*  BUN 23 24*  CREATININE 1.05* 0.98  CALCIUM 8.7* 7.7*  MG  --  1.8   Recent Labs    11/23/19 0840  AST 24  ALT 18  ALKPHOS 80  BILITOT 1.1  PROT 8.7*  ALBUMIN 4.4   Recent Labs    11/24/19 0428 11/24/19 1819  WBC 12.7* 15.7*  NEUTROABS  --  13.5*  HGB 14.7 12.9  HCT 42.8 38.2  MCV 91.3 92.5  PLT 183 154   No results for input(s): CKTOTAL, CKMB, TROPONINI in the last 72 hours. Invalid input(s): POCBNP No results for input(s): HGBA1C in the last 72 hours.   Weights: Filed Weights   11/23/19 0835 11/23/19 2006 11/25/19 0312  Weight: 88.9 kg 66.8 kg  66.5 kg     Radiology/Studies:  CT Head Wo Contrast  Result Date: 11/23/2019 CLINICAL DATA:  Larey Seat this morning.  Patient with baseline dementia. EXAM: CT HEAD WITHOUT CONTRAST TECHNIQUE: Contiguous axial images were obtained from the base of the skull through the vertex without intravenous contrast. COMPARISON:  None. FINDINGS: Brain: No evidence of acute infarction, hemorrhage, hydrocephalus, extra-axial collection or  mass lesion/mass effect. Mild patchy areas of white matter hypoattenuation are noted consistent with chronic microvascular ischemic change. Vascular: No hyperdense vessel or unexpected calcification. Skull: Normal. Negative for fracture or focal lesion. Sinuses/Orbits: Visualized globes and orbits are unremarkable. The visualized sinuses and mastoid air cells are clear. Other: Small right parietal scalp hematoma. IMPRESSION: 1. No acute intracranial abnormalities. 2. Small right parietal scalp hematoma.  No skull fracture Electronically Signed   By: Amie Portland M.D.   On: 11/23/2019 10:22   DG Pelvis Portable  Result Date: 11/24/2019 CLINICAL DATA:  Postop day 0 RIGHT hip hemiarthroplasty. EXAM: PORTABLE PELVIS 1-2 VIEWS 12:37 p.m.: COMPARISON:  Intraoperative pelvis x-ray earlier same day at 11:21 a.m. FINDINGS: Anatomic alignment in the AP projection post RIGHT unipolar hip arthroplasty. No acute complicating features. IMPRESSION: Anatomic alignment post RIGHT unipolar hip arthroplasty without acute complicating features. Electronically Signed   By: Hulan Saas M.D.   On: 11/24/2019 14:29   DG Pelvis Portable  Result Date: 11/24/2019 CLINICAL DATA:  Status post hemiarthroplasty EXAM: PORTABLE PELVIS 1-2 VIEWS COMPARISON:  Intraop arthroplasty FINDINGS: No sign of acute process with expected findings of femoral component and likely in the acetabulum. Film is oblique and there is gas about the RIGHT hip. Osteopenia without unexpected findings on single view. IMPRESSION: Intraoperative evaluation of RIGHT hip arthroplasty without unexpected findings. Electronically Signed   By: Donzetta Kohut M.D.   On: 11/24/2019 13:21   DG Chest Port 1 View  Result Date: 11/24/2019 CLINICAL DATA:  Fevers EXAM: PORTABLE CHEST 1 VIEW COMPARISON:  None. FINDINGS: Cardiac shadows within normal limits. Aortic calcifications are noted. The lungs are well aerated without focal infiltrate, effusion or pneumothorax.  Bronchitic markings are noted likely of a chronic nature. No focal confluent infiltrate is seen. Bony structures are within normal limits. IMPRESSION: Mild bronchitic changes without focal confluent infiltrate. Electronically Signed   By: Alcide Clever M.D.   On: 11/24/2019 04:00   DG Hip Unilat W or Wo Pelvis 2-3 Views Right  Result Date: 11/23/2019 CLINICAL DATA:  Fall with RIGHT hip deformity. EXAM: DG HIP (WITH OR WITHOUT PELVIS) 2-3V RIGHT COMPARISON:  None FINDINGS: Osteopenia with complete displacement of a RIGHT femoral neck fracture and superior migration of the distal femur relative to the femoral head. Femoral head appears located on submitted views. Degree of comminution may be present in there is sclerosis along the fracture margin some of this irregularity could be due to overlapping bone. Is and degree of osteopenia. No additional fractures. IMPRESSION: Complete displacement of a RIGHT femoral neck fracture. Superior migration of the distal femur relative to femoral head. Character of the bone is abnormal, potentially related to the degree of osteopenia. Based on appearance pathologic fracture could also be considered. Electronically Signed   By: Donzetta Kohut M.D.   On: 11/23/2019 09:53     Assessment and Recommendation  86 y.o. female with no previous history of cardiovascular disease having fall and right hip fracture with sinus bradycardia and second-degree type I AV block intermittently asymptomatic and not requiring further intervention now status post hip surgery without apparent  complications and no current evidence of angina syncope or acute coronary syndrome 1.  Begin physical rehabilitation without restriction following closely for any significant further symptoms or need for treatment and rhythm disturbance should respond to atropine and/or dopamine 2.  No further cardiac diagnostics necessary at this time secondary to no evidence of acute coronary syndrome congestive heart  failure or angina 3.  No additional medication management necessary from the cardiovascular standpoint  Signed, Arnoldo Hooker M.D. FACC

## 2019-11-25 NOTE — Evaluation (Signed)
Occupational Therapy Evaluation Patient Details Name: Jane Bailey MRN: [id number] DOB: May 15, 1930 Today's Date: 11/25/2019    History of Present Illness Jane Bailey is a 86 y.o. female with medical history significant of dementia, who presented to the ED on 11/23/19 from SNF [facility]) after a fall and right hip pain. X-ray of Right hip/pelvis showed a completely displaced RIGHT femoral neck fracture, likely osteopenia. Now s/p R hip arthoplasty 11/24/19.   Clinical Impression   Jane Bailey was seen for OT evaluation this date, POD#1 from above surgery. Pt lives at [facility] facility, son and daughter in room t/o session to provide PLOF, report pt near cognitive baseline. Pt was requiring assist for ADLs 2/2 vision impairments and son reports "arm-in-arm" assist for mobility. Upon entry, pt reports 9/10 RLE pain, refuses to participate in mobility assessment - RN notified and in room at end of session. Pt and family instructed in posterior total hip precautions, low vision meal setup, falls prevention strategies, home/routines modifications, DME/AE for LB bathing and dressing tasks, pain mgmt, and importance of mobility. Pt requires SETUP face washing at bed level. MIN A don [place] - pt unable or unwilling to place fully in nose. TOTAL A for LBD at bed level. Anticipate MAX A x2 for toileting at bed level. Pt would benefit from skilled OT to address noted impairments and functional limitations (see below for any additional details) in order to maximize safety and independence while minimizing falls risk and caregiver burden. Upon hospital discharge, recommend STR to maximize pt safety and return to PLOF.     Follow Up Recommendations  SNF    Equipment Recommendations   (TBD )    Recommendations for Other Services       Precautions / Restrictions Precautions Precautions: Fall;Posterior Hip Precaution Booklet Issued: Yes (comment) Restrictions Weight Bearing  Restrictions: Yes RLE Weight Bearing: Weight bearing as tolerated      Mobility Bed Mobility Overal bed mobility: Needs Assistance             General bed mobility comments: MIN A adjust torso at bed level - pt refused further mobility attempts  Transfers                 General transfer comment: Pt refused    Balance                                           ADL either performed or assessed with clinical judgement   ADL Overall ADL's : Needs assistance/impaired                                       General ADL Comments: SETUP face washing at bed levle. MIN A don [place] - pt unable or unwilling to place fully in nose. TOTAL A for LBD at bed level. Anticipate MAX A x2 for toileting at bed level.      Vision Baseline Vision/History: Macular Degeneration;Cataracts;Wears glasses Wears Glasses: At all times (not with her at hospital ) Patient Visual Report: No change from baseline       Perception     Praxis      Pertinent Vitals/Pain Pain Assessment: 0-10 Pain Score: 9  Pain Location: RLE  Pain Descriptors / Indicators: Grimacing;Aching;Discomfort Pain Intervention(s): Limited activity within patient's tolerance;Monitored  during session;Repositioned;Patient requesting pain meds-RN notified     Hand Dominance     Extremity/Trunk Assessment Upper Extremity Assessment Upper Extremity Assessment: RUE deficits/detail;LUE deficits/detail RUE Deficits / Details: Shoulder flexion limited ~90*, elobw flexion WFL. Grip 3/5 LUE Deficits / Details: Shoulder flexion limited ~90*, elobw flexion WFL. Grip 3/5   Lower Extremity Assessment Lower Extremity Assessment:  (Pt refused assessment )       Communication Communication Communication: HOH   Cognition Arousal/Alertness: Awake/alert Behavior During Therapy: Anxious Overall Cognitive Status: History of cognitive impairments - at baseline                                  General Comments: Follows 1 step commands c repetition - difficult to determine cognitive vs visual impairments for command following   General Comments       Exercises Exercises: Other exercises Other Exercises Other Exercises: Pt and family educated re: OT role, DME recs, d/c recs, posterior hip pcns, falls prevention, pain mgmt, importance of mobility for functional strengthening and pain mgmt Other Exercises: LBD, UBD, bed mobility   Shoulder Instructions      Home Living Family/patient expects to be discharged to:: Skilled nursing facility 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 )                                      Prior Functioning/Environment Level of Independence: Needs assistance  Gait / Transfers Assistance Needed: son reports no AD however required arm in arm 2/2 visual deficits ADL's / Homemaking Assistance Needed: Son reports assist for dressing/bathing and IADLs - unclear level of assist required but largley r/t visual deficits             OT Problem List: Decreased strength;Decreased range of motion;Decreased activity tolerance;Impaired balance (sitting and/or standing);Impaired vision/perception;Decreased cognition;Decreased safety awareness;Decreased knowledge of use of DME or AE;Pain      OT Treatment/Interventions: Self-care/ADL training;Therapeutic exercise;Energy conservation;DME and/or AE instruction;Therapeutic activities;Patient/family education;Balance training;Visual/perceptual remediation/compensation    OT Goals(Current goals can be found in the care plan section) Acute Rehab OT Goals Patient Stated Goal: Pain control  OT Goal Formulation: With patient/family Time For Goal Achievement: 12/09/19 Potential to Achieve Goals: Fair ADL Goals Pt Will Perform Grooming: with set-up;with supervision;bed level Pt Will Perform Lower Body Dressing: with caregiver independent in assisting;bed level;with mod  assist Pt Will Transfer to Toilet: with min assist (rolling at bed level)  OT Frequency: Min 2X/week   Barriers to D/C:            Co-evaluation              AM-PAC OT "6 Clicks" Daily Activity     Outcome Measure Help from another person eating meals?: A Little Help from another person taking care of personal grooming?: A Little Help from another person toileting, which includes using toliet, bedpan, or urinal?: Total Help from another person bathing (including washing, rinsing, drying)?: A Lot Help from another person to put on and taking off regular upper body clothing?: A Little Help from another person to put on and taking off regular lower body clothing?: Total 6 Click Score: 13   End of Session Equipment Utilized During Treatment: Oxygen Nurse Communication: Patient requests pain meds  Activity Tolerance: Patient limited by pain Patient left: in bed;with call bell/phone within reach;with bed  alarm set;with family/visitor present;with nursing/sitter in room (bone ofam applied)  OT Visit Diagnosis: Other abnormalities of gait and mobility (R26.89);Muscle weakness (generalized) (M62.81)                Time: 1191-4782 OT Time Calculation (min): 29 min Charges:  OT General Charges $OT Visit: 1 Visit OT Evaluation $OT Eval Moderate Complexity: 1 Mod OT Treatments $Self Care/Home Management : 23-37 mins  Kathie Dike, M.S. OTR/L  11/25/19, 11:57 AM  ascom [phone]

## 2019-11-25 NOTE — Progress Notes (Signed)
PROGRESS NOTE    Jane Bailey   [id number]  DOB: 1931/03/16  PCP: Drue Flirt, MD    DOA: 11/23/2019 LOS: 2   Brief Narrative   Jane Bailey is a 86 y.o. female with medical history significant of dementia, who presented to the ED on 11/23/19 from SNF after a fall and right hip pain. No loss of consciousness, per history was a mechanical fall, did strike right side of head, had mild headache on arrival, no neck pain.    In the ED, bradycardic with HR as low as the 30's to 40-60's.  EKG showed Mobitz type I AV block.  Afebrile, tachypneic, with otherwise normal vitals.  Labs notable for no leukocytosis, negative Covid-19 PCR, normal renal function and electrolytes, Hbg 16.0.  CT head negative for acute findings, showed a small right parietal scalp hematoma.    X-ray of Right hip/pelvis showed a completely displaced RIGHT femoral neck fracture, likely osteopenia.     Admitted to hospitalist service with Orthopedist on board.  Cardiology was consulted and cleared patient for surgery.        Assessment & Plan   Principal Problem:   Closed displaced fracture of right femoral neck (HCC) Active Problems:   Fall   Dementia (HCC)   Bradycardia   Closed displaced fracture of right femoral neck -status post mechanical fall and confirmed on x-ray. POD #1 from right hip hemiarthroplasty with Dr. Allena Katz.  Pain control with scheduled Tylenol and Robaxin, as needed oxycodone and IV morphine for breakthrough.  Zofran as needed.  PT evaluation.  WBAT on RLE.  Ortho follow up in 2 weeks for xray and staple removal.  Dementia without behavioral disturbance -not on medications.  Monitor for worsening confusion or agitation.  PRN Haldol.  Sinus bradycardia -chronic with heart rates 40s to 60s, but occasionally down in 30s.  Cardiology was consulted and cleared patient for surgery.  EKG on admission showed possible Mobitz type I AV block.  TSH is mildly elevated at 8.556.  Free T4  is pending.  Avoid AV nodal blockers.   Patient BMI: Body mass index is 23.68 kg/m.   DVT prophylaxis: enoxaparin (LOVENOX) injection 40 mg Start: 11/25/19 0800 SCDs Start: 11/25/19 0614 SCDs Start: 11/23/19 1159   Diet:  Diet Orders (From admission, onward)    Start     Ordered   11/24/19 1544  Diet regular Room service appropriate? Yes; Fluid consistency: Thin  Diet effective now       Question Answer Comment  Room service appropriate? Yes   Fluid consistency: Thin      11/24/19 1543            Code Status: Full Code    Subjective 11/25/19    Patient seen with son and daughter at bedside after patient worked with PT.  There reports she was able to stand up with a walker but required pain medications before being able to tolerate it.  She states her pain currently is so-so.  Patient denies other acute complaints including fevers or chills, chest pain shortness of breath, nausea vomiting.   Disposition Plan & Communication   Status is: Inpatient  Remains inpatient appropriate because:Inpatient level of care appropriate due to severity of illness.  Patient had surgery for hip fracture repair today, will require PT evaluation for disposition.   Dispo: The patient is from: SNF              Anticipated d/c is to: SNF  Anticipated d/c date is: 2 days              Patient currently is not medically stable to d/c.   Family Communication: Son and daughter at bedside during encounter   Consults, Procedures, Significant Events   Consultants:   Orthopedics, Dr. Allena Katz  Procedures:   8/1: Right hip hemiarthroplasty  Antimicrobials:   None   Objective   Vitals:   11/24/19 1533 11/24/19 1938 11/25/19 0312 11/25/19 0754  BP: (!) 131/47 (!) 109/46 (!) 123/58 (!) 122/35  Pulse: 49 46 50 47  Resp: Temp: 98.2 F (36.8 C) 98.3 F (36.8 C) 97.7 F (36.5 C) 98.8 F (37.1 C)  TempSrc: Oral Oral  Oral  SpO2: 95% 96% 95% 92%  Weight:   66.5  kg   Height:        Intake/Output Summary (Last 24 hours) at 11/25/2019 0804 Last data filed at 11/25/2019 0600 Gross per 24 hour  Intake 2406.69 ml  Output 675 ml  Net 1731.69 ml   Filed Weights   11/23/19 0835 11/23/19 2006 11/25/19 0312  Weight: 88.9 kg 66.8 kg 66.5 kg    Physical Exam:  General exam: awake, alert, no acute distress Respiratory system: CTAB, no wheezes, rales or rhonchi, normal respiratory effort. Cardiovascular system: normal S1/S2, RRR, no pedal edema.   Central nervous system: alert, oriented only to self. no gross focal neurologic deficits, normal speech Extremities: Right lateral hip with honeycomb dressing intact over surgical site, hands and feet with hypertrophic joints consistent with arthritis   Labs   Data Reviewed: I have personally reviewed following labs and imaging studies  CBC: Recent Labs  Lab 11/23/19 0840 11/24/19 0428 11/24/19 1819  WBC 9.3 12.7* 15.7*  NEUTROABS  --   --  13.5*  HGB 16.0* 14.7 12.9  HCT 46.7* 42.8 38.2  MCV 91.2 91.3 92.5  PLT 183 183 154   Basic Metabolic Panel: Recent Labs  Lab 11/23/19 0840 11/24/19 0428 11/25/19 0430  NA 139 137 137  K 3.9 4.0 3.8  CL 99 102 104  CO2 GLUCOSE 143* 185* 165*  BUN 16 23 24*  CREATININE 0.85 1.05* 0.98  CALCIUM 9.6 8.7* 7.7*  MG  --   --  1.8   GFR: Estimated Creatinine Clearance: 37.9 mL/min (by C-G formula based on SCr of 0.98 mg/dL). Liver Function Tests: Recent Labs  Lab 11/23/19 0840  AST 24  ALT 18  ALKPHOS 80  BILITOT 1.1  PROT 8.7*  ALBUMIN 4.4   No results for input(s): LIPASE, AMYLASE in the last 168 hours. No results for input(s): AMMONIA in the last 168 hours. Coagulation Profile: Recent Labs  Lab 11/23/19 0840  INR 1.0   Cardiac Enzymes: No results for input(s): CKTOTAL, CKMB, CKMBINDEX, TROPONINI in the last 168 hours. BNP (last 3 results) No results for input(s): PROBNP in the last 8760 hours. HbA1C: No results for  input(s): HGBA1C in the last 72 hours. CBG: No results for input(s): GLUCAP in the last 168 hours. Lipid Profile: No results for input(s): CHOL, HDL, LDLCALC, TRIG, CHOLHDL, LDLDIRECT in the last 72 hours. Thyroid Function Tests: Recent Labs    11/24/19 0421 11/24/19 0428  TSH  --  8.556*  FREET4 1.28*  --    Anemia Panel: No results for input(s): VITAMINB12, FOLATE, FERRITIN, TIBC, IRON, RETICCTPCT in the last 72 hours. Sepsis Labs: Recent Labs  Lab 11/24/19 0428  LATICACIDVEN 1.4  Recent Results (from the past 240 hour(s))  SARS Coronavirus 2 by RT PCR (hospital order, performed in [facility] hospital lab) Nasopharyngeal Nasopharyngeal Swab     Status: None   Collection Time: 11/23/19  8:41 AM   Specimen: Nasopharyngeal Swab  Result Value Ref Range Status   SARS Coronavirus 2 NEGATIVE NEGATIVE Final    Comment: (NOTE) SARS-CoV-2 target nucleic acids are NOT DETECTED.  The SARS-CoV-2 RNA is generally detectable in upper and lower respiratory specimens during the acute phase of infection. The lowest concentration of SARS-CoV-2 viral copies this assay can detect is 250 copies / mL. A negative result does not preclude SARS-CoV-2 infection and should not be used as the sole basis for treatment or other patient management decisions.  A negative result may occur with improper specimen collection / handling, submission of specimen other than nasopharyngeal swab, presence of viral mutation(s) within the areas targeted by this assay, and inadequate number of viral copies (<250 copies / mL). A negative result must be combined with clinical observations, patient history, and epidemiological information.  Fact Sheet for Patients:   BoilerBrush.com.cy  Fact Sheet for Healthcare Providers: https://pope.com/  This test is not yet approved or  cleared by the [place] FDA and has been authorized for detection and/or diagnosis of  SARS-CoV-2 by FDA under an Emergency Use Authorization (EUA).  This EUA will remain in effect (meaning this test can be used) for the duration of the COVID-19 declaration under Section 564(b)(1) of the Act, 21 U.S.C. section 360bbb-3(b)(1), unless the authorization is terminated or revoked sooner.  Performed at [facility], [address] [place], [place] [address]   MRSA PCR Screening     Status: None   Collection Time: 11/23/19  7:38 PM   Specimen: Nasopharyngeal  Result Value Ref Range Status   MRSA by PCR NEGATIVE NEGATIVE Final    Comment:        The GeneXpert MRSA Assay (FDA approved for NASAL specimens only), is one component of a comprehensive MRSA colonization surveillance program. It is not intended to diagnose MRSA infection nor to guide or monitor treatment for MRSA infections. Performed at [facility], [address] [place], [place] [address]   CULTURE, BLOOD (ROUTINE X 2) w Reflex to ID Panel     Status: None (Preliminary result)   Collection Time: 11/24/19  4:28 AM   Specimen: BLOOD  Result Value Ref Range Status   Specimen Description BLOOD RIGHT [facility]  Final   Special Requests   Final    BOTTLES DRAWN AEROBIC AND ANAEROBIC Blood Culture adequate volume   Culture   Final    NO GROWTH 1 DAY Performed at [facility], [address] [place], [place] [address]    Report Status PENDING  Incomplete  CULTURE, BLOOD (ROUTINE X 2) w Reflex to ID Panel     Status: None (Preliminary result)   Collection Time: 11/24/19  4:30 AM   Specimen: BLOOD  Result Value Ref Range Status   Specimen Description BLOOD LEFT [facility]  Final   Special Requests   Final    BOTTLES DRAWN AEROBIC AND ANAEROBIC Blood Culture adequate volume   Culture   Final    NO GROWTH 1 DAY Performed at [facility], [address], [place], [place] [address]    Report Status PENDING  Incomplete      Imaging Studies   CT Head Wo Contrast  Result Date:  11/23/2019 CLINICAL DATA:  Larey Seat this  morning.  Patient with baseline dementia. EXAM: CT HEAD WITHOUT CONTRAST TECHNIQUE: Contiguous axial images were obtained from the base of the skull through the vertex without intravenous contrast. COMPARISON:  None. FINDINGS: Brain: No evidence of acute infarction, hemorrhage, hydrocephalus, extra-axial collection or mass lesion/mass effect. Mild patchy areas of white matter hypoattenuation are noted consistent with chronic microvascular ischemic change. Vascular: No hyperdense vessel or unexpected calcification. Skull: Normal. Negative for fracture or focal lesion. Sinuses/Orbits: Visualized globes and orbits are unremarkable. The visualized sinuses and mastoid air cells are clear. Other: Small right parietal scalp hematoma. IMPRESSION: 1. No acute intracranial abnormalities. 2. Small right parietal scalp hematoma.  No skull fracture Electronically Signed   By: Amie Portland M.D.   On: 11/23/2019 10:22   DG Pelvis Portable  Result Date: 11/24/2019 CLINICAL DATA:  Postop day 0 RIGHT hip hemiarthroplasty. EXAM: PORTABLE PELVIS 1-2 VIEWS 12:37 p.m.: COMPARISON:  Intraoperative pelvis x-ray earlier same day at 11:21 a.m. FINDINGS: Anatomic alignment in the AP projection post RIGHT unipolar hip arthroplasty. No acute complicating features. IMPRESSION: Anatomic alignment post RIGHT unipolar hip arthroplasty without acute complicating features. Electronically Signed   By: Hulan Saas M.D.   On: 11/24/2019 14:29   DG Pelvis Portable  Result Date: 11/24/2019 CLINICAL DATA:  Status post hemiarthroplasty EXAM: PORTABLE PELVIS 1-2 VIEWS COMPARISON:  Intraop arthroplasty FINDINGS: No sign of acute process with expected findings of femoral component and likely in the acetabulum. Film is oblique and there is gas about the RIGHT hip. Osteopenia without unexpected findings on single view. IMPRESSION: Intraoperative evaluation of RIGHT hip arthroplasty without unexpected findings.  Electronically Signed   By: Donzetta Kohut M.D.   On: 11/24/2019 13:21   DG Chest Port 1 View  Result Date: 11/24/2019 CLINICAL DATA:  Fevers EXAM: PORTABLE CHEST 1 VIEW COMPARISON:  None. FINDINGS: Cardiac shadows within normal limits. Aortic calcifications are noted. The lungs are well aerated without focal infiltrate, effusion or pneumothorax. Bronchitic markings are noted likely of a chronic nature. No focal confluent infiltrate is seen. Bony structures are within normal limits. IMPRESSION: Mild bronchitic changes without focal confluent infiltrate. Electronically Signed   By: Alcide Clever M.D.   On: 11/24/2019 04:00   DG Hip Unilat W or Wo Pelvis 2-3 Views Right  Result Date: 11/23/2019 CLINICAL DATA:  Fall with RIGHT hip deformity. EXAM: DG HIP (WITH OR WITHOUT PELVIS) 2-3V RIGHT COMPARISON:  None FINDINGS: Osteopenia with complete displacement of a RIGHT femoral neck fracture and superior migration of the distal femur relative to the femoral head. Femoral head appears located on submitted views. Degree of comminution may be present in there is sclerosis along the fracture margin some of this irregularity could be due to overlapping bone. Is and degree of osteopenia. No additional fractures. IMPRESSION: Complete displacement of a RIGHT femoral neck fracture. Superior migration of the distal femur relative to femoral head. Character of the bone is abnormal, potentially related to the degree of osteopenia. Based on appearance pathologic fracture could also be considered. Electronically Signed   By: Donzetta Kohut M.D.   On: 11/23/2019 09:53     Medications   Scheduled Meds: . acetaminophen  1,000 mg Oral Q8H  . enoxaparin (LOVENOX) injection  40 mg Subcutaneous Q24H  . feeding supplement (ENSURE ENLIVE)  237 mL Oral BID BM  . methocarbamol  500 mg Oral Q8H  . multivitamin with minerals  1 tablet Oral Daily   Continuous Infusions: . sodium chloride 75 mL/hr at 11/25/19 0239  .  ceFAZolin  (ANCEF) IV 1 g (11/25/19 0701)       LOS: 2 days    Time spent: 15 minutes    Pennie BanterKelly A Tyara Dassow, DO [facility]  11/25/2019, 8:04 AM    If 7PM-7AM, please contact night-coverage. How to contact the [facility] Attending or Consulting provider 7A - 7P or covering provider during after hours 7P -7A, for this patient?    1. Check the care team in [facility] and look for a) attending/consulting [facility] provider listed and b) the [facility] team listed 2. Log into [email or web address] and use [place]'s universal password to access. If you do not have the password, please contact the hospital operator. 3. Locate the [facility] provider you are looking for under [facility] and page to a number that you can be directly reached. 4. If you still have difficulty reaching the provider, please page the [facility] (Director on Call) for the Hospitalists listed on amion for assistance.

## 2019-11-25 NOTE — Care Management Important Message (Signed)
Important Message  Patient Details  Name: AYLISSA HEINEMANN MRN: [id number] Date of Birth: April 10, 1931   Medicare Important Message Given:  Yes     Olegario Messier A Vikram Tillett 11/25/2019, 2:41 PM

## 2019-11-25 NOTE — Discharge Instructions (Signed)

## 2019-11-25 NOTE — Evaluation (Signed)
Physical Therapy Evaluation Patient Details Name: Jane Bailey MRN: [id number] DOB: 1930/07/10 Today's Date: 11/25/2019   History of Present Illness  Pt is a 86 y.o. female with medical history significant of dementia. Pt presents s/p a fall. Per MD impression, pt presents w/ complete displacement of R femoral neck fx and is now s/p R hip hemiarthroplasty 8/1; as well as bradycardia w/ possible Mobitz type I AV block.  Clinical Impression  Pt presents this a.m. reporting significant pain and general unwillingness to move. Pt only oriented to person; had to be reminded multiple times throughout the session that she just had hip surgery. Pt and son given education on the importance of movement at this stage of recovery. Pt not willing to perform supine exercises 2/2 pain and refused active-assist help from therapist as she did not want anyone to touch her RLE. Pt able to perform subtle, ~1in, ankle pumps on the right w/ max encouragement. With max encouragement, pt able to help participate with bed mobility using RUE on L hand rail; +2 max assist (BLE and trunk control) provided to help gently move pt supine-sit without breaking hip precautions. W/ max encouragement, pt willing to perform LAQ with active-assist from therapist w/ gradual decreasing assist and pt independently able to perform last 5 repetitions. Pt able to transfer sit-to-stand w/ +2 mod-A via a therapist on each side of pt; pt demonstrated some participation for 2nd half of the movement and notably shifted weight through LLE and BUE. Once in standing pt required mod-A to maintain upright posture; pt able to move RLE backwards ~1in with max encouragement. Pt able to stand for ~28min with continued mod-A before deciding to sit down w/ poor eccentric control. Pt returned to supine w/ +2 max-A and repositioned to help with pain. Pt's son given HEP packet with instructions on ideal exercise frequency and post-op hip precautions. Pt will benefit  from PT services in a SNF setting upon discharge to safely address deficits listed in patient problem list for decreased caregiver assistance and eventual return to PLOF.      Follow Up Recommendations SNF;Supervision/Assistance - 24 hour    Equipment Recommendations  None recommended by PT;Other (comment) (tbd at next venue of care)    Recommendations for Other Services       Precautions / Restrictions Precautions Precautions: Fall;Posterior Hip Precaution Booklet Issued: Yes (comment) Restrictions Weight Bearing Restrictions: Yes RLE Weight Bearing: Weight bearing as tolerated      Mobility  Bed Mobility Overal bed mobility: Needs Assistance Bed Mobility: Supine to Sit;Sit to Supine     Supine to sit: Max assist;+2 for physical assistance;HOB elevated Sit to supine: Max assist;+2 for physical assistance   General bed mobility comments: Pt generally unmotivated to help participate in bed mobility 2/2 pain. Pt and family given extensive education on the physiological benefits of mobility. x2 max assist required to help gently move pt. Pt did not appear to be in any further pain once in sitting; largely fears movement.  Transfers Overall transfer level: Needs assistance Equipment used: Rolling walker (2 wheeled) Transfers: Sit to/from Stand Sit to Stand: Mod assist;+2 physical assistance;From elevated surface         General transfer comment: Pt generally unwilling to move or put weight through RLE. Pt able to come to standing with a lot of encouragement and +2 mod assist w/ 1 therapist on each side. Pt able to provide good effort with LLE and BUE for the 2nd half of the movement.  Poor eccentric control with return to sit.  Ambulation/Gait             General Gait Details: Deferred. Pt +2 mod assist in standing to maintain upright posture. Generally unwilling to put weight through RLE and with max cueing/encouragement pt only able to move RLE ~1in back towards the  bed.  Stairs            Wheelchair Mobility    Modified Rankin (Stroke Patients Only)       Balance Overall balance assessment: Needs assistance Sitting-balance support: Bilateral upper extremity supported;Feet supported;Feet unsupported;Single extremity supported Sitting balance-Leahy Scale: Poor Sitting balance - Comments: Pt initially required mod assist to maintain upright sitting likely 2/2 to pain and not ready to sit up. Once pt got both hands on bed, able to maintain sitting balance with BUE support. Moderate postural sway noted each time she lifted an arm.   Standing balance support: Bilateral upper extremity supported Standing balance-Leahy Scale: Poor Standing balance comment: Pt req mod A to maintain upright standing balance 2/2 general unwillingness to shift weight onto RLE.                             Pertinent Vitals/Pain Pain Assessment: Faces Pain Score: 9  Faces Pain Scale: Hurts whole lot Pain Location: RLE  Pain Descriptors / Indicators: Grimacing;Aching;Discomfort;Sore;Constant;Operative site guarding Pain Intervention(s): Monitored during session;Premedicated before session;Repositioned    Home Living Family/patient expects to be discharged to:: Skilled nursing facility Living Arrangements: Other (Comment)               Additional Comments: [facility]    Prior Function Level of Independence: Needs assistance   Gait / Transfers Assistance Needed: son reports no AD however required arm in arm 2/2 visual deficits         Hand Dominance        Extremity/Trunk Assessment   Upper Extremity Assessment Upper Extremity Assessment: Defer to OT evaluation     Lower Extremity Assessment Lower Extremity Assessment: Generalized weakness;RLE deficits/detail RLE Deficits / Details: difficult to assess 2/2 pt unwillingness to participate. Pt able to demonstrate small amount of ankle dorsiflexion; at least  3-/5 quad strength demonstrated w/ LAQ       Communication   Communication: HOH  Cognition Arousal/Alertness: Awake/alert Behavior During Therapy: Anxious Overall Cognitive Status: History of cognitive impairments - at baseline                                 General Comments: A&Ox1 only oriented to person. Had to be reminded that she just had surgery. Follows 1 step commands c repetition - difficult to determine cognitive vs visual impairments for command following      General Comments General comments (skin integrity, edema, etc.): Purewick removed; nursing notified    Exercises Total Joint Exercises Ankle Circles/Pumps: AROM;Both;10 reps (unwilling to move R ankle more than ~1in; did not want help from therapist) Long Arc Quad: Right;Strengthening;AAROM;10 reps;AROM;5 reps Other Exercises  Other Exercises: Pt's son given PT HEP packet w/ instructions on exercise frequency as well as hip precautions (2/2 pt visual and cognitive impairments) Other Exercises: Pt given education on importance of movement following surgery   Assessment/Plan    PT Assessment Patient needs continued PT services  PT Problem List Decreased strength;Decreased range of motion;Decreased activity tolerance;Decreased balance;Decreased mobility;Decreased knowledge of use of  DME;Decreased knowledge of precautions;Pain       PT Treatment Interventions DME instruction;Gait training;Functional mobility training;Therapeutic activities;Therapeutic exercise;Balance training;Patient/family education    PT Goals (Current goals can be found in the Care Plan section)  Acute Rehab PT Goals Patient Stated Goal: Pain control  PT Goal Formulation: With patient Time For Goal Achievement: 12/08/19 Potential to Achieve Goals: Fair    Frequency 7X/week   Barriers to discharge        Co-evaluation               AM-PAC PT "6 Clicks" Mobility  Outcome Measure Help needed turning from your back  to your side while in a flat bed without using bedrails?: Total Help needed moving from lying on your back to sitting on the side of a flat bed without using bedrails?: Total Help needed moving to and from a bed to a chair (including a wheelchair)?: Total Help needed standing up from a chair using your arms (e.g., wheelchair or bedside chair)?: A Lot Help needed to walk in hospital room?: Total Help needed climbing 3-5 steps with a railing? : Total 6 Click Score: 7    End of Session Equipment Utilized During Treatment: Gait belt;Oxygen Activity Tolerance: Treatment limited secondary to agitation;Patient limited by pain Patient left: in bed;with call bell/phone within reach;with bed alarm set;with family/visitor present;with SCD's reapplied Nurse Communication: Mobility status PT Visit Diagnosis: Unsteadiness on feet (R26.81);Muscle weakness (generalized) (M62.81);Other abnormalities of gait and mobility (R26.89);Pain Pain - Right/Left: Right Pain - part of body: Hip    Time: 1696-7893 PT Time Calculation (min) (ACUTE ONLY): 38 min   Charges:             Kathaleya Mcduffee SPT 11/25/19, 1:41 PM

## 2019-11-25 NOTE — Progress Notes (Signed)
  Subjective: 1 Day Post-Op Procedure(s) (LRB): ARTHROPLASTY BIPOLAR HIP (HEMIARTHROPLASTY) (Right) Patient reports pain as mild.   Patient is well, and has had no acute complaints or problems Plan is to go Rehab after hospital stay. Negative for chest pain and shortness of breath Fever: no Gastrointestinal: Negative for nausea and vomiting  Objective: Vital signs in last 24 hours: Temp:  [97.7 F (36.5 C)-98.3 F (36.8 C)] 97.7 F (36.5 C) (08/02 0312) Pulse Rate:  [44-50] 50 (08/02 0312) Resp:  [15-22] 18 (08/02 0312) BP: (93-150)/(41-58) 123/58 (08/02 0312) SpO2:  [90 %-96 %] 95 % (08/02 0312) Weight:  [66.5 kg] 66.5 kg (08/02 0312)  Intake/Output from previous day:  Intake/Output Summary (Last 24 hours) at 11/25/2019 0605 Last data filed at 11/24/2019 1804 Gross per 24 hour  Intake 1302.47 ml  Output 1100 ml  Net 202.47 ml    Intake/Output this shift: No intake/output data recorded.  Labs: Recent Labs    11/23/19 0840 11/24/19 0428 11/24/19 1819  HGB 16.0* 14.7 12.9   Recent Labs    11/24/19 0428 11/24/19 1819  WBC 12.7* 15.7*  RBC 4.69 4.13  HCT 42.8 38.2  PLT 183 154   Recent Labs    11/24/19 0428 11/25/19 0430  NA 137 137  K 4.0 3.8  CL 102 104  CO2 24 23  BUN 23 24*  CREATININE 1.05* 0.98  GLUCOSE 185* 165*  CALCIUM 8.7* 7.7*   Recent Labs    11/23/19 0840  INR 1.0     EXAM General - Patient is Alert and Confused Extremity - Neurovascular intact Sensation intact distally Dorsiflexion/Plantar flexion intact Compartment soft Dressing/Incision - clean, dry, no drainage Motor Function - intact, moving foot and toes well on exam.   Past Medical History:  Diagnosis Date  . Dementia (HCC) 11/23/2019   Per daughter  . Fall 11/23/2019    Assessment/Plan: 1 Day Post-Op Procedure(s) (LRB): ARTHROPLASTY BIPOLAR HIP (HEMIARTHROPLASTY) (Right) Principal Problem:   Closed displaced fracture of right femoral neck (HCC) Active Problems:    Fall   Dementia (HCC)   Bradycardia  Estimated body mass index is 23.68 kg/m as calculated from the following:   Height as of this encounter: 5\' 6"  (1.676 m).   Weight as of this encounter: 66.5 kg. Advance diet Up with therapy D/C IV fluids  Follow-up at [facility] clinic orthopedics in 2 weeks for x-rays of the right hip and staple removal  DVT Prophylaxis - Lovenox, Foot Pumps and TED hose Weight-Bearing as tolerated to right leg  [facility], PA-C Orthopaedic Surgery 11/25/2019, 6:05 AM

## 2019-11-25 NOTE — TOC Initial Note (Signed)
Transition of Care [facility]) - Initial/Assessment Note    Patient Details  Name: Jane Bailey MRN: [id number] Date of Birth: Jul 16, 1930  Transition of Care [facility] CM/SW Contact:    Maree Krabbe, LCSW Phone Number: 11/25/2019, 2:11 PM  Clinical Narrative:     CSW spoke with pt's daughter via telephone. Pt is only alert to self. Pt's daughter confirmed pt was at [facility] long term and the plan is for pt to return at d/c. CSW will send facility appropriate documentation.               Expected Discharge Plan: Skilled Nursing Facility Barriers to Discharge: Continued Medical Work up   Patient Goals and CMS Choice Patient states their goals for this hospitalization and ongoing recovery are:: to get better      Expected Discharge Plan and Services Expected Discharge Plan: Skilled Nursing Facility In-house Referral: Clinical Social Work   Post Acute Care Choice: Skilled Nursing Facility Living arrangements for the past 2 months: Skilled Nursing Facility                                      Prior Living Arrangements/Services Living arrangements for the past 2 months: Skilled Nursing Facility Lives with:: Self Patient language and need for interpreter reviewed:: Yes Do you feel safe going back to the place where you live?: Yes      Need for Family Participation in Patient Care: Yes (Comment) Care giver support system in place?: Yes (comment)   Criminal Activity/Legal Involvement Pertinent to Current Situation/Hospitalization: No - Comment as needed  Activities of Daily Living Home Assistive Devices/Equipment: Eyeglasses, Environmental consultant (specify type) ADL Screening (condition at time of admission) Patient's cognitive ability adequate to safely complete daily activities?: No Is the patient deaf or have difficulty hearing?: No Does the patient have difficulty seeing, even when wearing glasses/contacts?: No Does the patient have difficulty concentrating, remembering, or  making decisions?: Yes Patient able to express need for assistance with ADLs?: Yes Does the patient have difficulty dressing or bathing?: Yes Independently performs ADLs?: Yes (appropriate for developmental age) Does the patient have difficulty walking or climbing stairs?: Yes Weakness of Legs: Both Weakness of Arms/Hands: Both  Permission Sought/Granted Permission sought to share information with : Family Supports    Share Information with NAME: Marylene Land  Permission granted to share info w AGENCY: [facility]  Permission granted to share info w Relationship: daughter     Emotional Assessment Appearance:: Appears stated age Attitude/Demeanor/Rapport: Unable to Assess Affect (typically observed): Unable to Assess Orientation: : Oriented to Self Alcohol / Substance Use: Not Applicable Psych Involvement: No (comment)  Admission diagnosis:  Right hip pain [M25.551] Minor head injury, initial encounter [S09.90XA] Injury of head, initial encounter [S09.90XA] Fall, initial encounter [W19.XXXA] Displaced fracture of right femoral neck (HCC) [S72.001A] Closed displaced fracture of right femoral neck (HCC) [S72.001A] Patient Active Problem List   Diagnosis Date Noted  . Fall 11/23/2019  . Dementia (HCC) 11/23/2019  . Closed displaced fracture of right femoral neck (HCC) 11/23/2019  . Bradycardia 11/23/2019   PCP:  Drue Flirt, MD Pharmacy:   [facility] - [place], [place] - [address] [place] [address] Phone: [phone] Fax: [phone]     Social Determinants of Health (SDOH) Interventions    Readmission Risk Interventions No flowsheet data found.

## 2019-11-25 NOTE — NC FL2 (Signed)
MEDICAID FL2 LEVEL OF CARE SCREENING TOOL     IDENTIFICATION  Patient Name: Jane Bailey Birthdate: [birth year] Sex: female Admission Date (Current Location): 11/23/2019  [place] and [place] Number:  Chiropodist and Address:  [facility], [address] [place], [place] [address]      Provider Number: [id number]  Attending Physician Name and Address:  Pennie Banter, DO  Relative Name and Phone Number:       Current Level of Care: Hospital Recommended Level of Care: Skilled Nursing Facility Prior Approval Number:    Date Approved/Denied:   PASRR Number:    Discharge Plan: SNF    Current Diagnoses: Patient Active Problem List   Diagnosis Date Noted  . Fall 11/23/2019  . Dementia (HCC) 11/23/2019  . Closed displaced fracture of right femoral neck (HCC) 11/23/2019  . Bradycardia 11/23/2019    Orientation RESPIRATION BLADDER Height & Weight     Self  O2 ([place] 3L) Continent Weight: 146 lb 11.1 oz (66.5 kg) Height:  5\' 6"  (167.6 cm)  BEHAVIORAL SYMPTOMS/MOOD NEUROLOGICAL BOWEL NUTRITION STATUS      Continent Diet (regular diet thin liquids)  AMBULATORY STATUS COMMUNICATION OF NEEDS Skin   Extensive Assist Verbally Surgical wounds (closed incision right leg, honeycomb dressing)                       Personal Care Assistance Level of Assistance  Bathing, Feeding, Dressing Bathing Assistance: Maximum assistance Feeding assistance: Independent Dressing Assistance: Maximum assistance     Functional Limitations Info  Sight, Hearing, Speech Sight Info: Adequate Hearing Info: Adequate Speech Info: Adequate    SPECIAL CARE FACTORS FREQUENCY  PT (By licensed PT), OT (By licensed OT)     PT Frequency: 5x OT Frequency: 5x            Contractures Contractures Info: Not present    Additional Factors Info  Code Status, Allergies Code Status Info: Full Code Allergies Info: No known allergies            Current Medications (11/25/2019):  This is the current hospital active medication list Current Facility-Administered Medications  Medication Dose Route Frequency Provider Last Rate Last Admin  . 0.9 %  sodium chloride infusion   Intravenous Continuous 01/25/2020, MD 75 mL/hr at 11/25/19 0239 New Bag at 11/25/19 0239  . acetaminophen (TYLENOL) tablet 1,000 mg  1,000 mg Oral Q8H Griffith, Kelly A, DO   1,000 mg at 11/25/19 1354  . atropine 1 MG/10ML injection 0.5 mg  0.5 mg Intravenous PRN 01/25/20, MD      . bisacodyl (DULCOLAX) suppository 10 mg  10 mg Rectal Daily PRN Signa Kell, MD      . ceFAZolin (ANCEF) IVPB 1 g/50 mL premix  1 g Intravenous Q6H Signa Kell, MD 100 mL/hr at 11/25/19 1314 1 g at 11/25/19 1314  . Chlorhexidine Gluconate Cloth 2 % PADS 6 each  6 each Topical Daily 01/25/20 A, DO   6 each at 11/25/19 1312  . docusate sodium (COLACE) capsule 100 mg  100 mg Oral BID 01/25/20, MD   100 mg at 11/25/19 0958  . enoxaparin (LOVENOX) injection 40 mg  40 mg Subcutaneous Q24H 01/25/20, MD   40 mg at 11/25/19 0957  . feeding supplement (ENSURE ENLIVE) (ENSURE ENLIVE) liquid 237 mL  237 mL Oral BID BM 01/25/20 A, DO   237 mL at 11/25/19 0958  . haloperidol  lactate (HALDOL) injection 1 mg  1 mg Intravenous Q6H PRN Esaw Grandchild A, DO   1 mg at 11/25/19 1355  . HYDROmorphone (DILAUDID) injection 0.2-0.4 mg  0.2-0.4 mg Intravenous Q4H PRN Signa Kell, MD      . menthol-cetylpyridinium (CEPACOL) lozenge 3 mg  1 lozenge Oral PRN Signa Kell, MD       Or  . phenol (CHLORASEPTIC) mouth spray 1 spray  1 spray Mouth/Throat PRN Signa Kell, MD      . methocarbamol (ROBAXIN) tablet 500 mg  500 mg Oral Q8H Griffith, Kelly A, DO   500 mg at 11/25/19 0606  . metoCLOPramide (REGLAN) tablet 5-10 mg  5-10 mg Oral Q8H PRN Signa Kell, MD       Or  . metoCLOPramide (REGLAN) injection 5-10 mg  5-10 mg Intravenous Q8H PRN Signa Kell, MD      . morphine 2 MG/ML injection  0.5 mg  0.5 mg Intravenous Q2H PRN Esaw Grandchild A, DO   0.5 mg at 11/25/19 1046  . multivitamin with minerals tablet 1 tablet  1 tablet Oral Daily Esaw Grandchild A, DO   1 tablet at 11/25/19 0957  . ondansetron (ZOFRAN) tablet 4 mg  4 mg Oral Q6H PRN Signa Kell, MD       Or  . ondansetron [facility]) injection 4 mg  4 mg Intravenous Q6H PRN Signa Kell, MD   4 mg at 11/25/19 1005  . oxyCODONE (Oxy IR/ROXICODONE) immediate release tablet 2.5-5 mg  2.5-5 mg Oral Q4H PRN Signa Kell, MD      . oxyCODONE (Oxy IR/ROXICODONE) immediate release tablet 5 mg  5 mg Oral Q4H PRN Esaw Grandchild A, DO   5 mg at 11/25/19 0958  . oxyCODONE (Oxy IR/ROXICODONE) immediate release tablet 5-10 mg  5-10 mg Oral Q4H PRN Signa Kell, MD      . traMADol Janean Sark) tablet 50 mg  50 mg Oral Q6H PRN Signa Kell, MD         Discharge Medications: Please see discharge summary for a list of discharge medications.  Relevant Imaging Results:  Relevant Lab Results:   Additional Information SSN:[id number]  Reuel Boom Ordell Prichett, LCSW

## 2019-11-26 LAB — CBC
HCT: 32.6 % — ABNORMAL LOW (ref 36.0–46.0)
Hemoglobin: 10.7 g/dL — ABNORMAL LOW (ref 12.0–15.0)
MCH: 30.9 pg (ref 26.0–34.0)
MCHC: 32.8 g/dL (ref 30.0–36.0)
MCV: 94.2 fL (ref 80.0–100.0)
Platelets: 128 10*3/uL — ABNORMAL LOW (ref 150–400)
RBC: 3.46 MIL/uL — ABNORMAL LOW (ref 3.87–5.11)
RDW: 13.1 % (ref 11.5–15.5)
WBC: 12 10*3/uL — ABNORMAL HIGH (ref 4.0–10.5)
nRBC: 0 % (ref 0.0–0.2)

## 2019-11-26 LAB — SURGICAL PATHOLOGY

## 2019-11-26 MED ORDER — TAMSULOSIN HCL 0.4 MG PO CAPS
0.4000 mg | ORAL_CAPSULE | Freq: Every day | ORAL | Status: DC
  Administered 2019-11-26 – 2019-11-28 (×3): 0.4 mg via ORAL
  Filled 2019-11-26 (×3): qty 1

## 2019-11-26 NOTE — Progress Notes (Signed)
In/Out catherization , 14 fr. Patient tolerated well. 1200cc of orange, clear urine returned to urine collection container

## 2019-11-26 NOTE — Progress Notes (Addendum)
PROGRESS NOTE    TAYIA STONESIFER   KZL:[id number]  DOB: 06/18/1930  PCP: Drue Flirt, MD    DOA: 11/23/2019 LOS: 3   Brief Narrative   Jane Bailey is a 86 y.o. female with medical history significant of dementia, who presented to the ED on 11/23/19 from SNF after a fall and right hip pain. No loss of consciousness, per history was a mechanical fall, did strike right side of head, had mild headache on arrival, no neck pain.    In the ED, bradycardic with HR as low as the 30's to 40-60's.  EKG showed Mobitz type I AV block.  Afebrile, tachypneic, with otherwise normal vitals.  Labs notable for no leukocytosis, negative Covid-19 PCR, normal renal function and electrolytes, Hbg 16.0.  CT head negative for acute findings, showed a small right parietal scalp hematoma.    X-ray of Right hip/pelvis showed a completely displaced RIGHT femoral neck fracture, likely osteopenia.     Admitted to hospitalist service with Orthopedist on board.  Cardiology was consulted and cleared patient for surgery.        Assessment & Plan   Principal Problem:   Closed displaced fracture of right femoral neck (HCC) Active Problems:   Fall   Dementia (HCC)   Bradycardia   Closed displaced fracture of right femoral neck -status post mechanical fall and confirmed on x-ray. POD #2 from right hip hemiarthroplasty with Dr. Allena Katz.  Pain control with scheduled Tylenol and Robaxin, as needed oxycodone and IV morphine for breakthrough.  Zofran as needed.  PT evaluation.  WBAT on RLE.  Ortho follow up in 2 weeks for xray and staple removal.  Dementia WITH behavioral disturbance (sundowning) -not on outpatient medications.  Monitor for worsening confusion or agitation.  PRN Haldol.  Sitter for safety if needed.  Sinus bradycardia -chronic with heart rates 40s to 60s, but occasionally down in 30s.  Cardiology was consulted and cleared patient for surgery.  EKG on admission showed possible Mobitz type I AV  block.  TSH is mildly elevated at 8.556.  Free T4 is pending.  Avoid AV nodal blockers.  Thrombocytopenia - NOT present on admission.  Plt trend 183 >> 154 >> 128 today.  She is on Lovenox for DVT prophylaxis.  Consider switch to low dose Eliquis, discuss with orthopedist.   Patient BMI: Body mass index is 23.68 kg/m.   DVT prophylaxis: enoxaparin (LOVENOX) injection 40 mg Start: 11/25/19 0800 SCDs Start: 11/25/19 3903   Diet:  Diet Orders (From admission, onward)    Start     Ordered   11/25/19 0808  Diet regular Room service appropriate? Yes; Fluid consistency: Thin  Diet effective now       Question Answer Comment  Room service appropriate? Yes   Fluid consistency: Thin      11/25/19 0807            Code Status: Full Code    Subjective 11/26/19    Patient seen with son and daughter at bedside.  She is sleeping and family report she "had a rough night".  They say she does have some sundowning episodes at baseline.  No acute events reported.  Patient awoke during physical exam, denied pain at the time.   Disposition Plan & Communication   Status is: Inpatient  Remains inpatient appropriate because:Inpatient level of care appropriate due to severity of illness.  Patient with acute urinary retention on POD 2, likely related to pain medications, requiring bladder scans and  catheterization.    Dispo: The patient is from: SNF              Anticipated d/c is to: SNF               Anticipated d/c date is: 2 days              Patient currently is not medically stable to d/c.   Family Communication: Son and daughter at bedside during encounter   Consults, Procedures, Significant Events   Consultants:   Orthopedics, Dr. Allena Katz  Procedures:   8/1: Right hip hemiarthroplasty  Antimicrobials:   None   Objective   Vitals:   11/25/19 1944 11/26/19 0015 11/26/19 0337 11/26/19 0744  BP: (!) 122/44 (!) 109/43 (!) 111/45 (!) 130/47  Pulse: (!) 57 (!) 49 (!) 44 (!) 47   Resp: 18 15 18 17   Temp: 99.6 F (37.6 C) 98.1 F (36.7 C) 98.3 F (36.8 C)   TempSrc: Oral  Oral   SpO2: 90% (!) 84% 90% 93%  Weight:      Height:        Intake/Output Summary (Last 24 hours) at 11/26/2019 1403 Last data filed at 11/26/2019 1008 Gross per 24 hour  Intake 1356.13 ml  Output 1200 ml  Net 156.13 ml   Filed Weights   11/23/19 0835 11/23/19 2006 11/25/19 0312  Weight: 88.9 kg 66.8 kg 66.5 kg    Physical Exam:  General exam: sleeping comfortably, no acute distress Respiratory system: CTAB, no wheezes, rales or rhonchi, normal respiratory effort. Cardiovascular system: normal S1/S2, RRR, no pedal edema.   Extremities: Right lateral hip with honeycomb dressing intact over surgical site   Labs   Data Reviewed: I have personally reviewed following labs and imaging studies  CBC: Recent Labs  Lab 11/23/19 0840 11/24/19 0428 11/24/19 1819 11/26/19 0447  WBC 9.3 12.7* 15.7* 12.0*  NEUTROABS  --   --  13.5*  --   HGB 16.0* 14.7 12.9 10.7*  HCT 46.7* 42.8 38.2 32.6*  MCV 91.2 91.3 92.5 94.2  PLT 183 183 154 128*   Basic Metabolic Panel: Recent Labs  Lab 11/23/19 0840 11/24/19 0428 11/25/19 0430  NA 139 137 137  K 3.9 4.0 3.8  CL 99 102 104  CO2 27 24 23   GLUCOSE 143* 185* 165*  BUN 16 23 24*  CREATININE 0.85 1.05* 0.98  CALCIUM 9.6 8.7* 7.7*  MG  --   --  1.8   GFR: Estimated Creatinine Clearance: 37.9 mL/min (by C-G formula based on SCr of 0.98 mg/dL). Liver Function Tests: Recent Labs  Lab 11/23/19 0840  AST 24  ALT 18  ALKPHOS 80  BILITOT 1.1  PROT 8.7*  ALBUMIN 4.4   No results for input(s): LIPASE, AMYLASE in the last 168 hours. No results for input(s): AMMONIA in the last 168 hours. Coagulation Profile: Recent Labs  Lab 11/23/19 0840  INR 1.0   Cardiac Enzymes: No results for input(s): CKTOTAL, CKMB, CKMBINDEX, TROPONINI in the last 168 hours. BNP (last 3 results) No results for input(s): PROBNP in the last 8760  hours. HbA1C: No results for input(s): HGBA1C in the last 72 hours. CBG: No results for input(s): GLUCAP in the last 168 hours. Lipid Profile: No results for input(s): CHOL, HDL, LDLCALC, TRIG, CHOLHDL, LDLDIRECT in the last 72 hours. Thyroid Function Tests: Recent Labs    11/24/19 0421 11/24/19 0428  TSH  --  8.556*  FREET4 1.28*  --    Anemia  Panel: No results for input(s): VITAMINB12, FOLATE, FERRITIN, TIBC, IRON, RETICCTPCT in the last 72 hours. Sepsis Labs: Recent Labs  Lab 11/24/19 0428  LATICACIDVEN 1.4    Recent Results (from the past 240 hour(s))  SARS Coronavirus 2 by RT PCR (hospital order, performed in [facility] hospital lab) Nasopharyngeal Nasopharyngeal Swab     Status: None   Collection Time: 11/23/19  8:41 AM   Specimen: Nasopharyngeal Swab  Result Value Ref Range Status   SARS Coronavirus 2 NEGATIVE NEGATIVE Final    Comment: (NOTE) SARS-CoV-2 target nucleic acids are NOT DETECTED.  The SARS-CoV-2 RNA is generally detectable in upper and lower respiratory specimens during the acute phase of infection. The lowest concentration of SARS-CoV-2 viral copies this assay can detect is 250 copies / mL. A negative result does not preclude SARS-CoV-2 infection and should not be used as the sole basis for treatment or other patient management decisions.  A negative result may occur with improper specimen collection / handling, submission of specimen other than nasopharyngeal swab, presence of viral mutation(s) within the areas targeted by this assay, and inadequate number of viral copies (<250 copies / mL). A negative result must be combined with clinical observations, patient history, and epidemiological information.  Fact Sheet for Patients:   BoilerBrush.com.cy  Fact Sheet for Healthcare Providers: https://pope.com/  This test is not yet approved or  cleared by the [place] FDA and has been authorized  for detection and/or diagnosis of SARS-CoV-2 by FDA under an Emergency Use Authorization (EUA).  This EUA will remain in effect (meaning this test can be used) for the duration of the COVID-19 declaration under Section 564(b)(1) of the Act, 21 U.S.C. section 360bbb-3(b)(1), unless the authorization is terminated or revoked sooner.  Performed at [facility], [address] [place], [place] [address]   MRSA PCR Screening     Status: None   Collection Time: 11/23/19  7:38 PM   Specimen: Nasopharyngeal  Result Value Ref Range Status   MRSA by PCR NEGATIVE NEGATIVE Final    Comment:        The GeneXpert MRSA Assay (FDA approved for NASAL specimens only), is one component of a comprehensive MRSA colonization surveillance program. It is not intended to diagnose MRSA infection nor to guide or monitor treatment for MRSA infections. Performed at [facility], [address] [place], [place] [address]   CULTURE, BLOOD (ROUTINE X 2) w Reflex to ID Panel     Status: None (Preliminary result)   Collection Time: 11/24/19  4:28 AM   Specimen: BLOOD  Result Value Ref Range Status   Specimen Description BLOOD RIGHT [facility]  Final   Special Requests   Final    BOTTLES DRAWN AEROBIC AND ANAEROBIC Blood Culture adequate volume   Culture   Final    NO GROWTH 2 DAYS Performed at [facility], [address] [place], [place] [address]    Report Status PENDING  Incomplete  CULTURE, BLOOD (ROUTINE X 2) w Reflex to ID Panel     Status: None (Preliminary result)   Collection Time: 11/24/19  4:30 AM   Specimen: BLOOD  Result Value Ref Range Status   Specimen Description BLOOD LEFT [facility]  Final   Special Requests   Final    BOTTLES DRAWN AEROBIC AND ANAEROBIC Blood Culture adequate volume   Culture   Final    NO GROWTH 2 DAYS Performed at [facility], [address] [place], [place] [address]  Report Status PENDING  Incomplete  Urine Culture     Status:  None   Collection Time: 11/24/19  5:28 AM   Specimen: Urine, Random  Result Value Ref Range Status   Specimen Description   Final    URINE, RANDOM Performed at [facility], [address] [place], [place] [address]    Special Requests   Final    NONE Performed at [facility], [address] [place], [place] [address]    Culture   Final    NO GROWTH Performed at [facility], [address] [place], [place] [address]    Report Status 11/25/2019 FINAL  Final      Imaging Studies   No results found.   Medications   Scheduled Meds: . acetaminophen  1,000 mg Oral Q8H  . Chlorhexidine Gluconate Cloth  6 each Topical Daily  . docusate sodium  100 mg Oral BID  . enoxaparin (LOVENOX) injection  40 mg Subcutaneous Q24H  . feeding supplement (ENSURE ENLIVE)  237 mL Oral BID BM  . methocarbamol  500 mg Oral Q8H  . multivitamin with minerals  1 tablet Oral Daily   Continuous Infusions: . sodium chloride 75 mL/hr at 11/26/19 0506       LOS: 3 days    Time spent: 15 minutes    Pennie BanterKelly A Jahayra Mazo, DO [facility]  11/26/2019, 2:03 PM    If 7PM-7AM, please contact night-coverage. How to contact the [facility] Attending or Consulting provider 7A - 7P or covering provider during after hours 7P -7A, for this patient?    1. Check the care team in [facility] and look for a) attending/consulting [facility] provider listed and b) the [facility] team listed 2. Log into [email or web address] and use [place]'s universal password to access. If you do not have the password, please contact the hospital operator. 3. Locate the [facility] provider you are looking for under [facility] and page to a number that you can be directly reached. 4. If you still have difficulty reaching the provider, please page the [facility] (Director on Call) for the Hospitalists listed on amion for assistance.

## 2019-11-26 NOTE — Progress Notes (Signed)
Physical Therapy Treatment Patient Details Name: Jane Bailey MRN: [id number] DOB: [birth year] Today's Date: 11/26/2019    History of Present Illness Pt is a 86 y.o. female with medical history significant of dementia. Pt presents s/p a fall. Per MD impression, pt presents w/ complete displacement of R femoral neck fx and is now s/p R hip hemiarthroplasty 8/1; as well as bradycardia w/ possible Mobitz type I AV block.    PT Comments    Pt tolerated RLE movement better this session and participated actively with below therex with heavy verbal and tactile cuing.  Pt continued to require total assist with bed mobility tasks but did demonstrate some minimal effort during sup to sit.  Pt continued to require physical assistance to maintain both static sitting and standing balance.  Pt was unable to ambulate but did demonstrate grossly improved ability to move her R foot forwards and backwards while standing.  Pt will benefit from PT services in a SNF setting upon discharge to safely address deficits listed in patient problem list for decreased caregiver assistance and eventual return to PLOF.   Follow Up Recommendations  SNF;Supervision/Assistance - 24 hour     Equipment Recommendations  Other (comment) (TBD at next venue of care)    Recommendations for Other Services       Precautions / Restrictions Precautions Precautions: Fall;Posterior Hip Precaution Booklet Issued: Yes (comment) Restrictions Weight Bearing Restrictions: Yes RLE Weight Bearing: Weight bearing as tolerated    Mobility  Bed Mobility Overal bed mobility: Needs Assistance Bed Mobility: Supine to Sit;Sit to Supine     Supine to sit: Max assist;+2 for physical assistance;HOB elevated Sit to supine: Max assist;+2 for physical assistance;HOB elevated   General bed mobility comments: Pt did put forth some effort with sup to sit this session but remained near total assist  Transfers Overall transfer level: Needs  assistance Equipment used: Rolling walker (2 wheeled) Transfers: Sit to/from Stand Sit to Stand: Mod assist;+2 physical assistance;From elevated surface         General transfer comment: Max verbal and tactile cues for positioning/sequencing  Ambulation/Gait         Gait velocity: Unable   General Gait Details: Pt able to stand with +2 mod A for stability and was able to move her R foot forwards and backwards minimally but was unable to mover her LLE   Stairs             Wheelchair Mobility    Modified Rankin (Stroke Patients Only)       Balance Overall balance assessment: Needs assistance Sitting-balance support: Bilateral upper extremity supported;Feet supported;Feet unsupported;Single extremity supported Sitting balance-Leahy Scale: Poor Sitting balance - Comments: Occasional Min A to prevent posterior LOB Postural control: Posterior lean Standing balance support: Bilateral upper extremity supported Standing balance-Leahy Scale: Poor Standing balance comment: Pt req mod A to maintain upright standing balance                            Cognition Arousal/Alertness: Awake/alert Behavior During Therapy: Flat affect Overall Cognitive Status: History of cognitive impairments - at baseline                                        Exercises Total Joint Exercises Ankle Circles/Pumps: AROM;AAROM;Both;10 reps Quad Sets: Strengthening;Right;10 reps Hip ABduction/ADduction: AAROM;Both;10 reps;Strengthening Straight Leg Raises: AAROM;Strengthening;Both;10 reps  Long Arc Quad: Right;Strengthening;10 reps;AROM Other Exercises Other Exercises: Pt/family education on posterior hip precautions and positioning in bed to prevent hip add and heel pressure    General Comments        Pertinent Vitals/Pain Pain Assessment: Faces Faces Pain Scale: Hurts even more Pain Descriptors / Indicators: Grimacing;Operative site guarding;Moaning Pain  Intervention(s): Premedicated before session;Monitored during session;Repositioned    Home Living                      Prior Function            PT Goals (current goals can now be found in the care plan section) Progress towards PT goals: Progressing toward goals    Frequency    7X/week      PT Plan Current plan remains appropriate    Co-evaluation              AM-PAC PT "6 Clicks" Mobility   Outcome Measure  Help needed turning from your back to your side while in a flat bed without using bedrails?: Total Help needed moving from lying on your back to sitting on the side of a flat bed without using bedrails?: Total Help needed moving to and from a bed to a chair (including a wheelchair)?: Total Help needed standing up from a chair using your arms (e.g., wheelchair or bedside chair)?: A Lot Help needed to walk in hospital room?: Total Help needed climbing 3-5 steps with a railing? : Total 6 Click Score: 7    End of Session Equipment Utilized During Treatment: Gait belt;Oxygen Activity Tolerance: Patient limited by pain Patient left: in bed;with call bell/phone within reach;with bed alarm set;with family/visitor present Nurse Communication: Mobility status (Per nursing no SCD use at this time) PT Visit Diagnosis: Unsteadiness on feet (R26.81);Muscle weakness (generalized) (M62.81);Other abnormalities of gait and mobility (R26.89);Pain Pain - Right/Left: Right Pain - part of body: Hip     Time: 0814-4818 PT Time Calculation (min) (ACUTE ONLY): 33 min  Charges:  $Therapeutic Exercise: 8-22 mins $Therapeutic Activity: 8-22 mins                     D. Scott Nettye Flegal PT, DPT 11/26/19, 11:24 AM

## 2019-11-26 NOTE — Progress Notes (Signed)
[facility] Encounter Note  Patient: Jane Bailey / Admit Date: 11/23/2019 / Date of Encounter: 11/26/2019, 10:06 AM   Subjective: Patient still having significant right leg pain most consistent with her fall and fracture now status post surgical intervention without apparent complication and healing .  No evidence of congestive heart failure angina and/or acute coronary syndrome at this time.  Telemetry does show some periods of sinus bradycardia as well as rare episodes of second-degree type I AV block asymptomatic and no more frequent .  This is similar to seen in the emergency room and currently appears not to be significant or concerning. No current evidence of angina congestive heart failure or acute coronary syndrome symptoms Review of Systems: Positive for: Hip pain Negative for: Vision change, hearing change, syncope, dizziness, nausea, vomiting,diarrhea, bloody stool, stomach pain, cough, congestion, diaphoresis, urinary frequency, urinary pain,skin lesions, skin rashes Others previously listed  Objective: Telemetry: Sinus bradycardia Physical Exam: Blood pressure (!) 130/47, pulse (!) 47, temperature 98.3 F (36.8 C), temperature source Oral, resp. rate 17, height 5\' 6"  (1.676 m), weight 66.5 kg, SpO2 93 %. Body mass index is 23.68 kg/m. General: Well developed, well nourished, in no acute distress. Head: Normocephalic, atraumatic, sclera non-icteric, no xanthomas, nares are without discharge. Neck: No apparent masses Lungs: Normal respirations with no wheezes, no rhonchi, no rales , no crackles   Heart: Regular rate and rhythm, normal S1 S2, no murmur, no rub, no gallop, PMI is normal size and placement, carotid upstroke normal without bruit, jugular venous pressure normal Abdomen: Soft, non-tender, non-distended with normoactive bowel sounds. No hepatosplenomegaly. Abdominal aorta is normal size without bruit Extremities: No edema, no clubbing, no  cyanosis, no ulcers,  Peripheral: 2+ radial, 2+ femoral, 2+ dorsal pedal pulses Neuro: Alert and oriented. Moves all extremities spontaneously. Psych:  Responds to questions appropriately with a normal affect.   Intake/Output Summary (Last 24 hours) at 11/26/2019 1006 Last data filed at 11/26/2019 0546 Gross per 24 hour  Intake 1476.13 ml  Output 0 ml  Net 1476.13 ml    Inpatient Medications:   acetaminophen  1,000 mg Oral Q8H   Chlorhexidine Gluconate Cloth  6 each Topical Daily   docusate sodium  100 mg Oral BID   enoxaparin (LOVENOX) injection  40 mg Subcutaneous Q24H   feeding supplement (ENSURE ENLIVE)  237 mL Oral BID BM   methocarbamol  500 mg Oral Q8H   multivitamin with minerals  1 tablet Oral Daily   Infusions:   sodium chloride 75 mL/hr at 11/26/19 0506    Labs: Recent Labs    11/24/19 0428 11/25/19 0430  NA 137 137  K 4.0 3.8  CL 102 104  CO2 24 23  GLUCOSE 185* 165*  BUN 23 24*  CREATININE 1.05* 0.98  CALCIUM 8.7* 7.7*  MG  --  1.8   No results for input(s): AST, ALT, ALKPHOS, BILITOT, PROT, ALBUMIN in the last 72 hours. Recent Labs    11/24/19 1819 11/26/19 0447  WBC 15.7* 12.0*  NEUTROABS 13.5*  --   HGB 12.9 10.7*  HCT 38.2 32.6*  MCV 92.5 94.2  PLT 154 128*   No results for input(s): CKTOTAL, CKMB, TROPONINI in the last 72 hours. Invalid input(s): POCBNP No results for input(s): HGBA1C in the last 72 hours.   Weights: Filed Weights   11/23/19 0835 11/23/19 2006 11/25/19 0312  Weight: 88.9 kg 66.8 kg 66.5 kg     Radiology/Studies:  CT Head Wo Contrast  Result Date: 11/23/2019 CLINICAL DATA:  Larey Seat this morning.  Patient with baseline dementia. EXAM: CT HEAD WITHOUT CONTRAST TECHNIQUE: Contiguous axial images were obtained from the base of the skull through the vertex without intravenous contrast. COMPARISON:  None. FINDINGS: Brain: No evidence of acute infarction, hemorrhage, hydrocephalus, extra-axial collection or mass  lesion/mass effect. Mild patchy areas of white matter hypoattenuation are noted consistent with chronic microvascular ischemic change. Vascular: No hyperdense vessel or unexpected calcification. Skull: Normal. Negative for fracture or focal lesion. Sinuses/Orbits: Visualized globes and orbits are unremarkable. The visualized sinuses and mastoid air cells are clear. Other: Small right parietal scalp hematoma. IMPRESSION: 1. No acute intracranial abnormalities. 2. Small right parietal scalp hematoma.  No skull fracture Electronically Signed   By: Amie Portland M.D.   On: 11/23/2019 10:22   DG Pelvis Portable  Result Date: 11/24/2019 CLINICAL DATA:  Postop day 0 RIGHT hip hemiarthroplasty. EXAM: PORTABLE PELVIS 1-2 VIEWS 12:37 p.m.: COMPARISON:  Intraoperative pelvis x-ray earlier same day at 11:21 a.m. FINDINGS: Anatomic alignment in the AP projection post RIGHT unipolar hip arthroplasty. No acute complicating features. IMPRESSION: Anatomic alignment post RIGHT unipolar hip arthroplasty without acute complicating features. Electronically Signed   By: Hulan Saas M.D.   On: 11/24/2019 14:29   DG Pelvis Portable  Result Date: 11/24/2019 CLINICAL DATA:  Status post hemiarthroplasty EXAM: PORTABLE PELVIS 1-2 VIEWS COMPARISON:  Intraop arthroplasty FINDINGS: No sign of acute process with expected findings of femoral component and likely in the acetabulum. Film is oblique and there is gas about the RIGHT hip. Osteopenia without unexpected findings on single view. IMPRESSION: Intraoperative evaluation of RIGHT hip arthroplasty without unexpected findings. Electronically Signed   By: Donzetta Kohut M.D.   On: 11/24/2019 13:21   DG Chest Port 1 View  Result Date: 11/24/2019 CLINICAL DATA:  Fevers EXAM: PORTABLE CHEST 1 VIEW COMPARISON:  None. FINDINGS: Cardiac shadows within normal limits. Aortic calcifications are noted. The lungs are well aerated without focal infiltrate, effusion or pneumothorax. Bronchitic  markings are noted likely of a chronic nature. No focal confluent infiltrate is seen. Bony structures are within normal limits. IMPRESSION: Mild bronchitic changes without focal confluent infiltrate. Electronically Signed   By: Alcide Clever M.D.   On: 11/24/2019 04:00   DG Hip Unilat W or Wo Pelvis 2-3 Views Right  Result Date: 11/23/2019 CLINICAL DATA:  Fall with RIGHT hip deformity. EXAM: DG HIP (WITH OR WITHOUT PELVIS) 2-3V RIGHT COMPARISON:  None FINDINGS: Osteopenia with complete displacement of a RIGHT femoral neck fracture and superior migration of the distal femur relative to the femoral head. Femoral head appears located on submitted views. Degree of comminution may be present in there is sclerosis along the fracture margin some of this irregularity could be due to overlapping bone. Is and degree of osteopenia. No additional fractures. IMPRESSION: Complete displacement of a RIGHT femoral neck fracture. Superior migration of the distal femur relative to femoral head. Character of the bone is abnormal, potentially related to the degree of osteopenia. Based on appearance pathologic fracture could also be considered. Electronically Signed   By: Donzetta Kohut M.D.   On: 11/23/2019 09:53     Assessment and Recommendation  86 y.o. female with no previous history of cardiovascular disease having fall and right hip fracture with sinus bradycardia and second-degree type I AV block intermittently asymptomatic and not requiring further intervention now status post hip surgery without apparent complications and no current evidence of angina syncope or acute coronary syndrome 1.  Continue  physical rehabilitation without restriction following closely for any significant further symptoms or need for treatment and rhythm disturbance should respond to atropine and/or dopamine but no signs in need for further tx at this time 2.  No further cardiac diagnostics necessary at this time secondary to no evidence of acute  coronary syndrome congestive heart failure or angina 3.  No additional medication management necessary from the cardiovascular standpoint 4. Call if further questions otherwise will assume pateint discharged to rehab with fu in few weeks  Signed, Arnoldo Hooker M.D. FACC

## 2019-11-26 NOTE — Progress Notes (Addendum)
Patient continues to remove O2 despite constant attempts at reorientation and encouragement. Patient also continues to remove abductor pillow from legs.

## 2019-11-26 NOTE — Progress Notes (Signed)
  Subjective: 2 Days Post-Op Procedure(s) (LRB): ARTHROPLASTY BIPOLAR HIP (HEMIARTHROPLASTY) (Right) Patient reports pain as mild.   Patient is well, and has had no acute complaints or problems Plan is to go Rehab after hospital stay. Negative for chest pain and shortness of breath Fever: no Gastrointestinal: Negative for nausea and vomiting  Objective: Vital signs in last 24 hours: Temp:  [97.8 F (36.6 C)-99.6 F (37.6 C)] 98.3 F (36.8 C) (08/03 0337) Pulse Rate:  [44-66] 44 (08/03 0337) Resp:  [15-18] 18 (08/03 0337) BP: (109-143)/(35-56) 111/45 (08/03 0337) SpO2:  [84 %-97 %] 90 % (08/03 0337)  Intake/Output from previous day:  Intake/Output Summary (Last 24 hours) at 11/26/2019 0654 Last data filed at 11/26/2019 0546 Gross per 24 hour  Intake 1716.13 ml  Output 0 ml  Net 1716.13 ml    Intake/Output this shift: Total I/O In: 75 [P.O.:75] Out: 0   Labs: Recent Labs    11/23/19 0840 11/24/19 0428 11/24/19 1819 11/26/19 0447  HGB 16.0* 14.7 12.9 10.7*   Recent Labs    11/24/19 1819 11/26/19 0447  WBC 15.7* 12.0*  RBC 4.13 3.46*  HCT 38.2 32.6*  PLT 154 128*   Recent Labs    11/24/19 0428 11/25/19 0430  NA 137 137  K 4.0 3.8  CL 102 104  CO2 24 23  BUN 23 24*  CREATININE 1.05* 0.98  GLUCOSE 185* 165*  CALCIUM 8.7* 7.7*   Recent Labs    11/23/19 0840  INR 1.0     EXAM General - Patient is Alert and Confused Extremity - Neurovascular intact Sensation intact distally Dorsiflexion/Plantar flexion intact Compartment soft Dressing/Incision - clean, dry, no drainage Motor Function - intact, moving foot and toes well on exam.  Transfers with physical therapy  Past Medical History:  Diagnosis Date  . Dementia (HCC) 11/23/2019   Per daughter  . Fall 11/23/2019    Assessment/Plan: 2 Days Post-Op Procedure(s) (LRB): ARTHROPLASTY BIPOLAR HIP (HEMIARTHROPLASTY) (Right) Principal Problem:   Closed displaced fracture of right femoral neck  (HCC) Active Problems:   Fall   Dementia (HCC)   Bradycardia  Estimated body mass index is 23.68 kg/m as calculated from the following:   Height as of this encounter: 5\' 6"  (1.676 m).   Weight as of this encounter: 66.5 kg. Advance diet Up with therapy D/C IV fluids  Follow-up at [facility] clinic orthopedics in 2 weeks for x-rays of the right hip and staple removal  DVT Prophylaxis - Lovenox, Foot Pumps and TED hose Weight-Bearing as tolerated to right leg  [facility], PA-C Orthopaedic Surgery 11/26/2019, 6:54 AM

## 2019-11-26 NOTE — Progress Notes (Signed)
Patient c/o not being able to void. Bladder scan revealed . Order placed by Dr. Denton Lank to I/O cath x 1.

## 2019-11-26 NOTE — Progress Notes (Signed)
Patient bladder scan revealed . This Clinical research associate in and out cath with 66fr , tolerated well for 600cc of orange color clear urine

## 2019-11-27 DIAGNOSIS — J9621 Acute and chronic respiratory failure with hypoxia: Secondary | ICD-10-CM

## 2019-11-27 DIAGNOSIS — M25551 Pain in right hip: Secondary | ICD-10-CM

## 2019-11-27 DIAGNOSIS — I4430 Unspecified atrioventricular block: Secondary | ICD-10-CM

## 2019-11-27 DIAGNOSIS — R5381 Other malaise: Secondary | ICD-10-CM

## 2019-11-27 LAB — CBC
HCT: 30 % — ABNORMAL LOW (ref 36.0–46.0)
Hemoglobin: 9.9 g/dL — ABNORMAL LOW (ref 12.0–15.0)
MCH: 31.1 pg (ref 26.0–34.0)
MCHC: 33 g/dL (ref 30.0–36.0)
MCV: 94.3 fL (ref 80.0–100.0)
Platelets: 142 10*3/uL — ABNORMAL LOW (ref 150–400)
RBC: 3.18 MIL/uL — ABNORMAL LOW (ref 3.87–5.11)
RDW: 13.1 % (ref 11.5–15.5)
WBC: 10.1 10*3/uL (ref 4.0–10.5)
nRBC: 0 % (ref 0.0–0.2)

## 2019-11-27 LAB — URINALYSIS, COMPLETE (UACMP) WITH MICROSCOPIC
Bilirubin Urine: NEGATIVE
Glucose, UA: NEGATIVE mg/dL
Hgb urine dipstick: NEGATIVE
Ketones, ur: NEGATIVE mg/dL
Nitrite: NEGATIVE
Protein, ur: NEGATIVE mg/dL
Specific Gravity, Urine: 1.023 (ref 1.005–1.030)
pH: 5 (ref 5.0–8.0)

## 2019-11-27 LAB — HEMOGLOBIN A1C
Hgb A1c MFr Bld: 6.1 % — ABNORMAL HIGH (ref 4.8–5.6)
Mean Plasma Glucose: 128.37 mg/dL

## 2019-11-27 LAB — VITAMIN D 25 HYDROXY (VIT D DEFICIENCY, FRACTURES): Vit D, 25-Hydroxy: 21.22 ng/mL — ABNORMAL LOW (ref 30–100)

## 2019-11-27 MED ORDER — SENNA 8.6 MG PO TABS: 1.0000 | ORAL_TABLET | Freq: Two times a day (BID) | ORAL | Status: DC | PRN

## 2019-11-27 MED ORDER — SENNA 8.6 MG PO TABS
1.0000 | ORAL_TABLET | Freq: Two times a day (BID) | ORAL | Status: DC
  Administered 2019-11-27 – 2019-11-28 (×2): 8.6 mg via ORAL
  Filled 2019-11-27 (×2): qty 1

## 2019-11-27 MED ORDER — BETHANECHOL CHLORIDE 25 MG PO TABS
25.0000 mg | ORAL_TABLET | Freq: Three times a day (TID) | ORAL | Status: DC
  Administered 2019-11-27 – 2019-11-28 (×3): 25 mg via ORAL
  Filled 2019-11-27 (×5): qty 1

## 2019-11-27 MED ORDER — POLYETHYLENE GLYCOL 3350 17 G PO PACK
17.0000 g | PACK | Freq: Two times a day (BID) | ORAL | Status: DC | PRN
  Administered 2019-11-27: 17 g via ORAL
  Filled 2019-11-27: qty 1

## 2019-11-27 NOTE — Progress Notes (Addendum)
Physical Therapy Treatment Patient Details Name: Jane Bailey MRN: [id number] DOB: Sep 17, 1930 Today's Date: 11/27/2019    History of Present Illness Pt is a 86 y.o. female with medical history significant of dementia. Pt presents s/p a fall. Per MD impression, pt presents w/ complete displacement of R femoral neck fx and is now s/p R hip hemiarthroplasty 8/1; as well as bradycardia w/ possible Mobitz type I AV block.    PT Comments    Pt was awake in long sitting with son present upon arriving. She reports pain in RLE and is very limited throughout session D/T pain. Pt has baseline dementia. Attempted to mobilize to sit EOB however pt unable to tolerate. She did perform AAROM/AROM exercises while in bed. Pt will benefit from continued skilled PT to address deficits with strength, mobility, transfers, and gait. Recommend SNF @ DC to continue PT and progress pt to PLOF.     Follow Up Recommendations  SNF;Supervision/Assistance - 24 hour     Equipment Recommendations  Other (comment) (defer to next level of care)    Recommendations for Other Services       Precautions / Restrictions Precautions Precautions: Fall;Posterior Hip Precaution Booklet Issued: Yes (comment)    Mobility  Bed Mobility Overal bed mobility: Needs Assistance Bed Mobility: Supine to Sit           General bed mobility comments: Pt was unable/unwilling to achieve EOB sitting. becomes frustrated reports severe pain with movements to achieve sitting. she was repositioned back into bed and performed ther ex in bed.  Transfers                 General transfer comment: unable/unsafe  Ambulation/Gait                 Stairs             Wheelchair Mobility    Modified Rankin (Stroke Patients Only)       Balance                                            Cognition Arousal/Alertness: Awake/alert Behavior During Therapy: Flat affect Overall Cognitive Status:  History of cognitive impairments - at baseline Area of Impairment: Safety/judgement;Awareness;Problem solving;Orientation;Attention;Memory;Following commands                 Orientation Level: Person Current Attention Level: Alternating Memory: Decreased recall of precautions;Decreased short-term memory Following Commands: Follows one step commands consistently;Follows one step commands with increased time Safety/Judgement: Decreased awareness of safety;Decreased awareness of deficits     General Comments: Pt was alert throughout session but is disoriented. She was able to follow commands however incraesed time required and redirecting at times to focus on desired task. pt does get frustrated with therapist upon attempt to sit pt up EOB. unwilling to sit up      Exercises Total Joint Exercises Ankle Circles/Pumps: AROM;AAROM;Both;10 reps Quad Sets: Strengthening;Right;10 reps Hip ABduction/ADduction: AAROM;Both;10 reps;Strengthening Straight Leg Raises: AAROM;Strengthening;Both;10 reps General Exercises - Lower Extremity Gluteal Sets: AROM;5 reps;Supine    General Comments        Pertinent Vitals/Pain Faces Pain Scale: Hurts even more Pain Location: RLE  Pain Descriptors / Indicators: Grimacing;Operative site guarding;Moaning    Home Living                      Prior Function  PT Goals (current goals can now be found in the care plan section) Acute Rehab PT Goals Patient Stated Goal: none stated    Frequency    7X/week      PT Plan Current plan remains appropriate    Co-evaluation              AM-PAC PT "6 Clicks" Mobility   Outcome Measure  Help needed turning from your back to your side while in a flat bed without using bedrails?: Total Help needed moving from lying on your back to sitting on the side of a flat bed without using bedrails?: Total Help needed moving to and from a bed to a chair (including a wheelchair)?:  Total Help needed standing up from a chair using your arms (e.g., wheelchair or bedside chair)?: Total Help needed to walk in hospital room?: Total Help needed climbing 3-5 steps with a railing? : Total 6 Click Score: 6    End of Session Equipment Utilized During Treatment: Gait belt (discontinued O2 and maintained > 94% (RN notified)) Activity Tolerance: Patient limited by pain;Other (comment) (limited by cognition deficits) Patient left: in bed;with call bell/phone within reach;with bed alarm set;with family/visitor present Nurse Communication: Mobility status PT Visit Diagnosis: Unsteadiness on feet (R26.81);Muscle weakness (generalized) (M62.81);Other abnormalities of gait and mobility (R26.89);Pain Pain - Right/Left: Right Pain - part of body: Hip     Time:  -     Charges:                        Jetta Lout PTA 11/27/19, 1:58 PM

## 2019-11-27 NOTE — Progress Notes (Signed)
  Subjective: 3 Days Post-Op Procedure(s) (LRB): ARTHROPLASTY BIPOLAR HIP (HEMIARTHROPLASTY) (Right) Patient reports pain as mild.   Patient is well, and has had no acute complaints or problems Plan is to go Rehab after hospital stay. Negative for chest pain and shortness of breath Fever: no Gastrointestinal: Negative for nausea and vomiting  Objective: Vital signs in last 24 hours: Temp:  [97.7 F (36.5 C)-99 F (37.2 C)] 99 F (37.2 C) (08/04 0045) Pulse Rate:  [47-50] 50 (08/04 0045) Resp:  [14-18] 14 (08/04 0045) BP: (119-144)/(41-51) 119/41 (08/04 0045) SpO2:  [93 %-94 %] 94 % (08/04 0045)  Intake/Output from previous day:  Intake/Output Summary (Last 24 hours) at 11/27/2019 0645 Last data filed at 11/27/2019 0604 Gross per 24 hour  Intake 240 ml  Output 2100 ml  Net -1860 ml    Intake/Output this shift: Total I/O In: -  Out: 300 [Urine:300]  Labs: Recent Labs    11/24/19 1819 11/26/19 0447 11/27/19 0557  HGB 12.9 10.7* 9.9*   Recent Labs    11/26/19 0447 11/27/19 0557  WBC 12.0* 10.1  RBC 3.46* 3.18*  HCT 32.6* 30.0*  PLT 128* 142*   Recent Labs    11/25/19 0430  NA 137  K 3.8  CL 104  CO2 23  BUN 24*  CREATININE 0.98  GLUCOSE 165*  CALCIUM 7.7*   No results for input(s): LABPT, INR in the last 72 hours.   EXAM General - Patient is Alert and Confused Extremity - Neurovascular intact Sensation intact distally Dorsiflexion/Plantar flexion intact Compartment soft Dressing/Incision - clean, dry, no drainage Motor Function - intact, moving foot and toes well on exam.  Transfers with physical therapy  Past Medical History:  Diagnosis Date  . Dementia (HCC) 11/23/2019   Per daughter  . Fall 11/23/2019    Assessment/Plan: 3 Days Post-Op Procedure(s) (LRB): ARTHROPLASTY BIPOLAR HIP (HEMIARTHROPLASTY) (Right) Principal Problem:   Closed displaced fracture of right femoral neck (HCC) Active Problems:   Fall   Dementia (HCC)    Bradycardia  Estimated body mass index is 23.68 kg/m as calculated from the following:   Height as of this encounter: 5\' 6"  (1.676 m).   Weight as of this encounter: 66.5 kg. Advance diet Up with therapy D/C IV fluids  Follow-up at [facility] clinic orthopedics in 2 weeks for x-rays of the right hip and staple removal  DVT Prophylaxis - Lovenox, Foot Pumps and TED hose Weight-Bearing as tolerated to right leg  [facility], PA-C Orthopaedic Surgery 11/27/2019, 6:45 AM

## 2019-11-27 NOTE — Progress Notes (Signed)
PROGRESS NOTE  Jane Bailey BUL:[id number] DOB: 02/03/1931   PCP: Drue Flirt, MD  Patient is from: SNF.  DOA: 11/23/2019 LOS: 4  Brief Narrative / Interim history: 86 y.o.femalewith PMH of dementia,who presented to the ED on 11/23/19 from SNF after a fall andright hip pain and found to have completely displaced right femoral neck fracture, and small right parietal scalp hematoma.  She underwent right hip hemiarthroplasty by Dr. Allena Katz on 11/24/2019.  Subjective: Seen and examined earlier this morning.  She required I&O's twice yesterday.  Seems like she shows able to void 300 cc overnight.  Complains some pain in his shoulders and back.  Denies chest pain or dyspnea.  Objective: Vitals:   11/27/19 0045 11/27/19 0739 11/27/19 0800 11/27/19 1002  BP: (!) 119/41 (!) 108/41  (!) 141/58  Pulse: (!) 50 (!) 34 (!) 52 92  Resp: 14 17  18   Temp: 99 F (37.2 C) 97.8 F (36.6 C)  97.8 F (36.6 C)  TempSrc: Oral Oral  Oral  SpO2: 94% 91%  99%  Weight:      Height:        Intake/Output Summary (Last 24 hours) at 11/27/2019 1453 Last data filed at 11/27/2019 1341 Gross per 24 hour  Intake 480 ml  Output 900 ml  Net -420 ml   Filed Weights   11/23/19 0835 11/23/19 2006 11/25/19 0312  Weight: 88.9 kg 66.8 kg 66.5 kg    Examination:  GENERAL: No apparent distress.  Nontoxic. HEENT: MMM.  Vision and hearing grossly intact.  NECK: Supple.  No apparent JVD.  RESP: 94% on 2 L.  No IWOB.  Fair aeration bilaterally. CVS:  RRR. Heart sounds normal.  ABD/GI/GU: BS+. Abd soft, NTND.  MSK/EXT:  Moves extremities. No apparent deformity. No edema.  SKIN: Honeycomb dressing over right hip DCI. NEURO: Awake, alert and oriented fairly.  No apparent focal neuro deficit. PSYCH: Calm.  Somewhat flat affect.  Procedures:  11/24/2019-right hip hemiarthroplasty by Dr. 01/24/2020  Microbiology summarized: 7/31-COVID-19 PCR negative. 7/31-MRSA PCR negative. 8/1-urine culture and blood  cultures NGTD.  Assessment & Plan: Fall at nursing facility Closed displaced fracture of right femoral neck -S/p right hip hemiarthroplasty by Dr. 11-25-1999 on 11/24/2019.   -Pain pathway per orthopedics but will minimize opiates and other muscle relaxers with anticholinergic side effect in the setting of possible urinary retention.   -WBAT on RLE.    Subcu Lovenox for VT prophylaxis -Ortho follow up in 2 weeks for xray and staple removal.  Acute respiratory failure with hypoxia: Seems like she desaturated to 84% on RA on 8/3 and started on 2 L by [place].  She denies cardiopulmonary symptoms but not a great historian.  Lung exam reassuring.  No signs of fluid overload.  Suspect this to be due to atelectasis.  Now saturating at 94% on 2 L by [place]. -Wean oxygen to room air -Incentive spirometry/OOB/PT/OT  Dementia WITH behavioral disturbance (sundowning) -not on outpatient medications.   -PRN Haldol.   -Sitter for safety if needed. -Frequent reorientation and delirium precautions.  Sinus bradycardia with possible Mobitz type I AVB -chronic with heart rates 40s to 60s, but occasionally down in 30s.  Cardiology was consulted and cleared patient for surgery.  EKG on admission showed possible Mobitz type I AV block.  TSH is mildly elevated at 8.556.  Free T4 1.28. -Avoid AV nodal blockers.  Acute blood loss anemia: Likely combination of acute blood loss from surgery and dilutional fluid.  No signs  of GI bleed. -Continue monitoring  Euthyroid sick syndrome -Consider repeating thyroid panel in 3 to 4 weeks.  Hyperglycemia without diagnosis of diabetes -Check hemoglobin A1c  Acute urinary retention-required I&O twice yesterday.  Had about 600 cc on I&O -Bladder scan every 8 hours -Start bethanechol 25 mg 3 times daily -Minimize opiates and muscle relaxers.  Constipation: Due to opiate? -Aggressive bowel regimen  Thrombocytopenia: Platelet 183 >> 154 >> 128> 142.  Unlikely HIT.  -Continue  monitoring  Debility/physical deconditioning -Continue PT/OT  Increase nutritional need Body mass index is 23.68 kg/m. Nutrition Problem: Increased nutrient needs Etiology: hip fracture, post-op healing Signs/Symptoms: estimated needs Interventions: Ensure Enlive (each supplement provides 350kcal and 20 grams of protein), MVI   DVT prophylaxis:  enoxaparin (LOVENOX) injection 40 mg Start: 11/25/19 0800 SCDs Start: 11/25/19 9735  Code Status: Full code Family Communication: Updated patient's son at bedside. Status is: Inpatient  Remains inpatient appropriate because:Unsafe d/c plan   Dispo: The patient is from: SNF              Anticipated d/c is to: SNF              Anticipated d/c date is: 1 day              Patient currently is medically stable to d/c.       Consultants:  Orthopedic surgery   Sch Meds:  Scheduled Meds:  acetaminophen  1,000 mg Oral Q8H   bethanechol  25 mg Oral TID   Chlorhexidine Gluconate Cloth  6 each Topical Daily   docusate sodium  100 mg Oral BID   enoxaparin (LOVENOX) injection  40 mg Subcutaneous Q24H   feeding supplement (ENSURE ENLIVE)  237 mL Oral BID BM   methocarbamol  500 mg Oral Q8H   multivitamin with minerals  1 tablet Oral Daily   tamsulosin  0.4 mg Oral Daily   Continuous Infusions:  sodium chloride Stopped (11/27/19 1353)   PRN Meds:.atropine, bisacodyl, haloperidol lactate, HYDROmorphone (DILAUDID) injection, menthol-cetylpyridinium **OR** phenol, metoCLOPramide **OR** metoCLOPramide (REGLAN) injection, morphine injection, ondansetron **OR** ondansetron (ZOFRAN) IV, oxyCODONE, oxyCODONE, oxyCODONE, polyethylene glycol, senna, traMADol  Antimicrobials: Anti-infectives (From admission, onward)   Start     Dose/Rate Route Frequency Ordered Stop   11/25/19 0613  ceFAZolin (ANCEF) IVPB 1 g/50 mL premix        1 g 100 mL/hr over 30 Minutes Intravenous Every 6 hours 11/25/19 0613 11/25/19 1904   11/23/19 1800   ceFAZolin (ANCEF) IVPB 2g/100 mL premix        2 g 200 mL/hr over 30 Minutes Intravenous  Once 11/23/19 1705 11/24/19 1039       I have personally reviewed the following labs and images: CBC: Recent Labs  Lab 11/23/19 0840 11/24/19 0428 11/24/19 1819 11/26/19 0447 11/27/19 0557  WBC 9.3 12.7* 15.7* 12.0* 10.1  NEUTROABS  --   --  13.5*  --   --   HGB 16.0* 14.7 12.9 10.7* 9.9*  HCT 46.7* 42.8 38.2 32.6* 30.0*  MCV 91.2 91.3 92.5 94.2 94.3  PLT 183 183 154 128* 142*   BMP &GFR Recent Labs  Lab 11/23/19 0840 11/24/19 0428 11/25/19 0430  NA 139 137 137  K 3.9 4.0 3.8  CL 99 102 104  CO2 27 24 23   GLUCOSE 143* 185* 165*  BUN 16 23 24*  CREATININE 0.85 1.05* 0.98  CALCIUM 9.6 8.7* 7.7*  MG  --   --  1.8   Estimated Creatinine  Clearance: 37.9 mL/min (by C-G formula based on SCr of 0.98 mg/dL). Liver & Pancreas: Recent Labs  Lab 11/23/19 0840  AST 24  ALT 18  ALKPHOS 80  BILITOT 1.1  PROT 8.7*  ALBUMIN 4.4   No results for input(s): LIPASE, AMYLASE in the last 168 hours. No results for input(s): AMMONIA in the last 168 hours. Diabetic: No results for input(s): HGBA1C in the last 72 hours. No results for input(s): GLUCAP in the last 168 hours. Cardiac Enzymes: No results for input(s): CKTOTAL, CKMB, CKMBINDEX, TROPONINI in the last 168 hours. No results for input(s): PROBNP in the last 8760 hours. Coagulation Profile: Recent Labs  Lab 11/23/19 0840  INR 1.0   Thyroid Function Tests: No results for input(s): TSH, T4TOTAL, FREET4, T3FREE, THYROIDAB in the last 72 hours. Lipid Profile: No results for input(s): CHOL, HDL, LDLCALC, TRIG, CHOLHDL, LDLDIRECT in the last 72 hours. Anemia Panel: No results for input(s): VITAMINB12, FOLATE, FERRITIN, TIBC, IRON, RETICCTPCT in the last 72 hours. Urine analysis:    Component Value Date/Time   COLORURINE COLORLESS (A) 11/24/2019 0528   APPEARANCEUR CLEAR (A) 11/24/2019 0528   LABSPEC 1.000 (L) 11/24/2019 0528    PHURINE 5.0 11/24/2019 0528   GLUCOSEU NEGATIVE 11/24/2019 0528   HGBUR NEGATIVE 11/24/2019 0528   BILIRUBINUR NEGATIVE 11/24/2019 0528   KETONESUR NEGATIVE 11/24/2019 0528   PROTEINUR NEGATIVE 11/24/2019 0528   NITRITE NEGATIVE 11/24/2019 0528   LEUKOCYTESUR NEGATIVE 11/24/2019 0528   Sepsis Labs: Invalid input(s): PROCALCITONIN, LACTICIDVEN  Microbiology: Recent Results (from the past 240 hour(s))  SARS Coronavirus 2 by RT PCR (hospital order, performed in [facility] hospital lab) Nasopharyngeal Nasopharyngeal Swab     Status: None   Collection Time: 11/23/19  8:41 AM   Specimen: Nasopharyngeal Swab  Result Value Ref Range Status   SARS Coronavirus 2 NEGATIVE NEGATIVE Final    Comment: (NOTE) SARS-CoV-2 target nucleic acids are NOT DETECTED.  The SARS-CoV-2 RNA is generally detectable in upper and lower respiratory specimens during the acute phase of infection. The lowest concentration of SARS-CoV-2 viral copies this assay can detect is 250 copies / mL. A negative result does not preclude SARS-CoV-2 infection and should not be used as the sole basis for treatment or other patient management decisions.  A negative result may occur with improper specimen collection / handling, submission of specimen other than nasopharyngeal swab, presence of viral mutation(s) within the areas targeted by this assay, and inadequate number of viral copies (<250 copies / mL). A negative result must be combined with clinical observations, patient history, and epidemiological information.  Fact Sheet for Patients:   BoilerBrush.com.cy  Fact Sheet for Healthcare Providers: https://pope.com/  This test is not yet approved or  cleared by the [place] FDA and has been authorized for detection and/or diagnosis of SARS-CoV-2 by FDA under an Emergency Use Authorization (EUA).  This EUA will remain in effect (meaning this test can be used) for the  duration of the COVID-19 declaration under Section 564(b)(1) of the Act, 21 U.S.C. section 360bbb-3(b)(1), unless the authorization is terminated or revoked sooner.  Performed at [facility], [address] [place], [place] [address]   MRSA PCR Screening     Status: None   Collection Time: 11/23/19  7:38 PM   Specimen: Nasopharyngeal  Result Value Ref Range Status   MRSA by PCR NEGATIVE NEGATIVE Final    Comment:        The GeneXpert MRSA Assay (FDA approved for NASAL specimens  only), is one component of a comprehensive MRSA colonization surveillance program. It is not intended to diagnose MRSA infection nor to guide or monitor treatment for MRSA infections. Performed at [facility], [address] [place], [place] [address]   CULTURE, BLOOD (ROUTINE X 2) w Reflex to ID Panel     Status: None (Preliminary result)   Collection Time: 11/24/19  4:28 AM   Specimen: BLOOD  Result Value Ref Range Status   Specimen Description [facility]  Final   Special Requests   Final    BOTTLES DRAWN AEROBIC AND ANAEROBIC Blood Culture adequate volume   Culture   Final    NO GROWTH 3 DAYS Performed at [facility], [address] [place], [place] [address]    Report Status PENDING  Incomplete  CULTURE, BLOOD (ROUTINE X 2) w Reflex to ID Panel     Status: None (Preliminary result)   Collection Time: 11/24/19  4:30 AM   Specimen: BLOOD  Result Value Ref Range Status   Specimen Description [facility]OD [facility]  Final   Special Requests   Final    BOTTLES DRAWN AEROBIC AND ANAEROBIC Blood Culture adequate volume   Culture   Final    NO GROWTH 3 DAYS Performed at [facility], [address] [place], [place] [address]    Report Status PENDING  Incomplete  Urine Culture     Status: None   Collection Time: 11/24/19  5:28 AM   Specimen: Urine, Random  Result Value Ref Range Status   Specimen Description   Final    URINE, RANDOM Performed at  [facility], [address] [place], [place] [address]    Special Requests   Final    NONE Performed at [facility], [address] [place], [place] [address]    Culture   Final    NO GROWTH Performed at [facility], [address] [place], [place] [address]    Report Status 11/25/2019 FINAL  Final    Radiology Studies: No results found.     Noa Galvao T. Nivaan Dicenzo [facility]  If 7PM-7AM, please contact night-coverage [email or web address] Password [email or web address] 11/27/2019, 2:53 PM

## 2019-11-27 NOTE — Progress Notes (Addendum)
   11/27/19 0739  Assess: MEWS Score  Temp 97.8 F (36.6 C)  BP (!) 108/41  Pulse Rate (!) 34  Resp 17  SpO2 91 %  O2 Device Nasal Cannula  O2 Flow Rate (L/min) 2 L/min  Assess: MEWS Score  MEWS Temp 0  MEWS Systolic 0  MEWS Pulse 2  MEWS RR 0  MEWS LOC 0  MEWS Score 2  MEWS Score Color Yellow   Concerned with bradycardiac pulse. Re-assessed at 08:00. HR:52  NT obtained new set of vitals at 10:02    11/27/19 1002  Assess: MEWS Score  Temp 97.8 F (36.6 C)  BP (!) 141/58  Pulse Rate 92  Resp 18  Level of Consciousness Alert  SpO2 99 %  O2 Device Room Air  O2 Flow Rate (L/min) 2 L/min  Treat  Pain Scale 0-10  Pain Score 10  Faces Pain Scale 2  Pain Type Surgical pain  Pain Location Hip  Pain Orientation Right  Pain Intervention(s) Medication (See eMAR);Cold applied  Notify: Provider  Provider Name/Title Dr. Alanda Slim  Date Provider Notified 11/27/19  Time Provider Notified 1038  Notification Type Page  Notification Reason Other (Comment) (change in HR)  Response No new orders   MD notified. No new order noted.

## 2019-11-27 NOTE — TOC Progression Note (Signed)
Transition of Care [facility]) - Progression Note    Patient Details  Name: Jane Bailey MRN: [id number] Date of Birth: [birth year]  Transition of Care [facility]) CM/SW [place], RN Phone Number: 11/27/2019, 10:49 AM  Clinical Narrative:    Met with the patient and her daughter in the room, the daughter has legal guardianship, I made a copy of the paperwork and placed on the chart and gave the original back to the daughter, She was asking about transportation, I explained that the patient will go back to [facility] with EMS transporting, She asked about the patient having a scheduled eye surgery at the end of the month, I encouraged her to speak to the Dr and ask if they would release her to have surgery that soon.  She stated understanding   Expected Discharge Plan: [place] Barriers to Discharge: Continued Medical Work up  Expected Discharge Plan and Services Expected Discharge Plan: [place] In-house Referral: Clinical Social Work   Post Acute Care Choice: [place] Living arrangements for the past 2 months: [place]                                       Social Determinants of Health (SDOH) Interventions    Readmission Risk Interventions No flowsheet data found.

## 2019-11-28 DIAGNOSIS — R338 Other retention of urine: Secondary | ICD-10-CM

## 2019-11-28 DIAGNOSIS — S0990XA Unspecified injury of head, initial encounter: Secondary | ICD-10-CM

## 2019-11-28 DIAGNOSIS — Z96649 Presence of unspecified artificial hip joint: Secondary | ICD-10-CM

## 2019-11-28 DIAGNOSIS — Z7189 Other specified counseling: Secondary | ICD-10-CM

## 2019-11-28 LAB — CBC
HCT: 27.1 % — ABNORMAL LOW (ref 36.0–46.0)
Hemoglobin: 9.4 g/dL — ABNORMAL LOW (ref 12.0–15.0)
MCH: 32 pg (ref 26.0–34.0)
MCHC: 34.7 g/dL (ref 30.0–36.0)
MCV: 92.2 fL (ref 80.0–100.0)
Platelets: 153 10*3/uL (ref 150–400)
RBC: 2.94 MIL/uL — ABNORMAL LOW (ref 3.87–5.11)
RDW: 13.1 % (ref 11.5–15.5)
WBC: 8.1 10*3/uL (ref 4.0–10.5)
nRBC: 0 % (ref 0.0–0.2)

## 2019-11-28 LAB — SARS CORONAVIRUS 2 (TAT 6-24 HRS): SARS Coronavirus 2: NEGATIVE

## 2019-11-28 MED ORDER — ENSURE ENLIVE PO LIQD: 237.0000 mL | Freq: Two times a day (BID) | ORAL | 12 refills | Status: AC

## 2019-11-28 MED ORDER — ONDANSETRON HCL 4 MG PO TABS: 4.0000 mg | ORAL_TABLET | Freq: Four times a day (QID) | ORAL | 0 refills | Status: DC | PRN

## 2019-11-28 MED ORDER — BETHANECHOL CHLORIDE 10 MG PO TABS: 10.0000 mg | ORAL_TABLET | Freq: Three times a day (TID) | ORAL | 0 refills | Status: AC

## 2019-11-28 MED ORDER — ACETAMINOPHEN 500 MG PO TABS: 1000.0000 mg | ORAL_TABLET | Freq: Three times a day (TID) | ORAL | 0 refills | Status: AC

## 2019-11-28 MED ORDER — DOCUSATE SODIUM 100 MG PO CAPS: 100.0000 mg | ORAL_CAPSULE | Freq: Two times a day (BID) | ORAL | 0 refills | Status: AC

## 2019-11-28 MED ORDER — MAGNESIUM CITRATE PO SOLN
1.0000 | Freq: Once | ORAL | Status: AC
  Administered 2019-11-28: 1 via ORAL
  Filled 2019-11-28: qty 296

## 2019-11-28 MED ORDER — BISACODYL 10 MG RE SUPP: 10.0000 mg | Freq: Every day | RECTAL | 0 refills | Status: DC | PRN

## 2019-11-28 NOTE — Care Management Important Message (Signed)
Important Message  Patient Details  Name: Jane Bailey MRN: [id number] Date of Birth: 12-Jan-1931   Medicare Important Message Given:  Yes     Barrie Dunker, RN 11/28/2019, 3:02 PM

## 2019-11-28 NOTE — TOC Progression Note (Signed)
Transition of Care [facility]) - Progression Note    Patient Details  Name: Jane Bailey MRN: [id number] Date of Birth: May 31, 1930  Transition of Care [facility]) CM/SW Contact  Barrie Dunker, RN Phone Number: 11/28/2019, 2:17 PM  Clinical Narrative:   DC packet is on the chart, the beside nurse has been made aware and is to call report to the facility, her daughter and Legal Guardian has been made aware of the DC.     Expected Discharge Plan: Skilled Nursing Facility Barriers to Discharge: Continued Medical Work up  Expected Discharge Plan and Services Expected Discharge Plan: Skilled Nursing Facility In-house Referral: Clinical Social Work   Post Acute Care Choice: Skilled Nursing Facility Living arrangements for the past 2 months: Skilled Nursing Facility Expected Discharge Date: 11/28/19                                     Social Determinants of Health (SDOH) Interventions    Readmission Risk Interventions No flowsheet data found.

## 2019-11-28 NOTE — Discharge Summary (Signed)
Physician Discharge Summary  Jane Bailey:[id number] DOB: 04/04/1931 DOA: 11/23/2019  PCP: Drue Flirt, MD  Admit date: 11/23/2019 Discharge date: 11/28/2019  Admitted From: SNF Disposition: SNF  Recommendations for Outpatient Follow-up:  1. Follow ups as below. 2. Please obtain CBC/BMP/Mag at follow up 3. Urology follow up if she fails voiding trial in one week 4. Please follow up on the following pending results: None   Discharge Condition: Stable CODE STATUS: Full code   Follow-up Information    Jane Skeens, PA-C. Schedule an appointment as soon as possible for a visit in 2 week(s).   Specialty: Orthopedic Surgery Why: For right hip x-rays and staple removal Contact information: [address] [place] [address] [phone]               Hospital Course: 86 y.o.femalewith PMH of dementia,who presented to the ED on 11/23/19 from SNF after a fall andright hip pain and found to have completely displaced right femoral neck fracture, and small right parietal scalp hematoma.  She underwent right hip hemiarthroplasty by Dr. Allena Katz on 11/24/2019.  Hospital course complicated by acute respiratory failure, acute urinary retention and constipation.  Briefly required supplemental oxygen but liberated.  Constipation resolved but continued to require I&O.  She is discharged with Foley catheter.  Recommend voiding trial in 1 week.  If she fails voiding trial, recommend referral to urology.   See individual problem list below for more hospital course.  Discharge Diagnoses:  Fall at nursing facility Closed displaced fracture of right femoral neck -S/p right hip hemiarthroplasty by Dr. Allena Katz on 11/24/2019.   -Scheduled Tylenol and as needed oxycodone for pain -WBAT on RLE.   Subcu Lovenox for VTE prophylaxis for 4 weeks after surgery -Ortho follow up in 2 weeks for xray and staple removal.  Acute respiratory failure with hypoxia: Seems  like she desaturated to 84% on RA on 8/3 and started on 2 L by Kaka. Likely atelectasis.  Resolved with incentive spirometry.  Liberated of oxygen. -Incentive spirometry/OOB/PT/OT  DementiaWITHbehavioral disturbance(sundowning)-not onoutpatientmedications.  -Frequent reorientation and delirium precautions.  Sinus bradycardia with possible Mobitz type I AVB-chronic with heart rates 40s to 60s, but occasionally down in 30s. Cardiology was consulted and cleared patient for surgery. EKG on admission showed possible Mobitz type I AV block. TSH is mildly elevated at 8.556. Free T4 1.28. -Avoid AV nodal blockers.  Acute blood loss anemia: Likely combination of acute blood loss from surgery and dilutional fluid.  No signs of GI bleed.  H&H relatively stable now. -Recheck CBC in 1 week.  Euthyroid sick syndrome -Consider repeating thyroid panel in 3 to 4 weeks.  Hyperglycemia without diagnosis of diabetes: A1c 6.1%.  Acute urinary retention- -Continue bethanechol 10 mg 3 times daily -Continue Flomax 0.4 mg daily. -Recommend voiding trial in one week. If she fails reinsert foley and follow up with urology  Constipation: Due to opiate?  Seems to have resolved.  Had BM -Continue aggressive bowel regimen while on opiates.  Thrombocytopenia: Platelet 183 >>154 >>128>  153.  Unlikely HIT. Resolved.  Debility/physical deconditioning -Continue PT/OT at SNF.  Goal of care discussion: 86 year old female with above comorbidities including intermittent sinus bradycardia with heart rate down to 30s at times.  We discussed about goal of care including CODE STATUS.  We have discussed the pros and cons of CPR and intubation given age and comorbidities.  I recommended against any of these measures in case of emergency but patient son and  daughter undecided.  They would like to discuss with the other children before they make decision.  Increase nutritional need Body mass index is 23.68  kg/m. Nutrition Problem: Increased nutrient needs Etiology: hip fracture, post-op healing Signs/Symptoms: estimated needs Interventions: Ensure Enlive (each supplement provides 350kcal and 20 grams of protein), MVI      Discharge Exam: Vitals:   11/27/19 2332 11/28/19 0735  BP: (!) 131/49 122/65  Pulse: (!) 49 77  Resp: 16 18  Temp: 99.8 F (37.7 C) 98.7 F (37.1 C)  SpO2: 93% 94%    GENERAL: No apparent distress.  Nontoxic. HEENT: MMM.  Vision and hearing grossly intact.  NECK: Supple.  No apparent JVD.  RESP: On room air.  No IWOB.  Fair aeration bilaterally. CVS:  RRR. Heart sounds normal.  ABD/GI/GU: Bowel sounds present. Soft. Non tender.  MSK/EXT:  Moves extremities. No apparent deformity. No edema.  SKIN: Honeycomb dressing over surgical site DCI. NEURO: Awake, alert and oriented fairly..  No apparent focal neuro deficit. PSYCH: Calm. Normal affect.  Discharge Instructions  Discharge Instructions    Diet - low sodium heart healthy   Complete by: As directed    Increase activity slowly   Complete by: As directed    No dressing needed   Complete by: As directed      Allergies as of 11/28/2019   No Known Allergies     Medication List    TAKE these medications   acetaminophen 500 MG tablet Commonly known as: TYLENOL Take 2 tablets (1,000 mg total) by mouth every 8 (eight) hours.   bethanechol 10 MG tablet Commonly known as: URECHOLINE Take 1 tablet (10 mg total) by mouth 3 (three) times daily.   bisacodyl 10 MG suppository Commonly known as: DULCOLAX Place 1 suppository (10 mg total) rectally daily as needed for moderate constipation.   CERTA PLUS SENIOR PO Take 1 tablet by mouth daily.   docusate sodium 100 MG capsule Commonly known as: COLACE Take 1 capsule (100 mg total) by mouth 2 (two) times daily.   enoxaparin 40 MG/0.4ML injection Commonly known as: LOVENOX Inject 0.4 mLs (40 mg total) into the skin daily for 14 doses.   feeding  supplement (ENSURE ENLIVE) Liqd Take 237 mLs by mouth 2 (two) times daily between meals.   Nyamyc powder Generic drug: nystatin Apply 1 application topically in the morning and at bedtime.   ondansetron 4 MG tablet Commonly known as: ZOFRAN Take 1 tablet (4 mg total) by mouth every 6 (six) hours as needed for nausea.   oxyCODONE 5 MG immediate release tablet Commonly known as: Oxy IR/ROXICODONE Take 1 tablet (5 mg total) by mouth every 4 (four) hours as needed for severe pain.            Discharge Care Instructions  (From admission, onward)         Start     Ordered   11/28/19 0000  No dressing needed        11/28/19 1206          Consultations:  Cardiology  Orthopedic surgery  Procedures/Studies:  11/24/2019-right hip hemiarthroplasty by Dr. Allena Katz   CT Head Wo Contrast  Result Date: 11/23/2019 CLINICAL DATA:  Larey Seat this morning.  Patient with baseline dementia. EXAM: CT HEAD WITHOUT CONTRAST TECHNIQUE: Contiguous axial images were obtained from the base of the skull through the vertex without intravenous contrast. COMPARISON:  None. FINDINGS: Brain: No evidence of acute infarction, hemorrhage, hydrocephalus, extra-axial collection or mass  lesion/mass effect. Mild patchy areas of white matter hypoattenuation are noted consistent with chronic microvascular ischemic change. Vascular: No hyperdense vessel or unexpected calcification. Skull: Normal. Negative for fracture or focal lesion. Sinuses/Orbits: Visualized globes and orbits are unremarkable. The visualized sinuses and mastoid air cells are clear. Other: Small right parietal scalp hematoma. IMPRESSION: 1. No acute intracranial abnormalities. 2. Small right parietal scalp hematoma.  No skull fracture Electronically Signed   By: Amie Portland M.D.   On: 11/23/2019 10:22   DG Pelvis Portable  Result Date: 11/24/2019 CLINICAL DATA:  Postop day 0 RIGHT hip hemiarthroplasty. EXAM: PORTABLE PELVIS 1-2 VIEWS 12:37 p.m.:  COMPARISON:  Intraoperative pelvis x-ray earlier same day at 11:21 a.m. FINDINGS: Anatomic alignment in the AP projection post RIGHT unipolar hip arthroplasty. No acute complicating features. IMPRESSION: Anatomic alignment post RIGHT unipolar hip arthroplasty without acute complicating features. Electronically Signed   By: Hulan Saas M.D.   On: 11/24/2019 14:29   DG Pelvis Portable  Result Date: 11/24/2019 CLINICAL DATA:  Status post hemiarthroplasty EXAM: PORTABLE PELVIS 1-2 VIEWS COMPARISON:  Intraop arthroplasty FINDINGS: No sign of acute process with expected findings of femoral component and likely in the acetabulum. Film is oblique and there is gas about the RIGHT hip. Osteopenia without unexpected findings on single view. IMPRESSION: Intraoperative evaluation of RIGHT hip arthroplasty without unexpected findings. Electronically Signed   By: Donzetta Kohut M.D.   On: 11/24/2019 13:21   DG Chest Port 1 View  Result Date: 11/24/2019 CLINICAL DATA:  Fevers EXAM: PORTABLE CHEST 1 VIEW COMPARISON:  None. FINDINGS: Cardiac shadows within normal limits. Aortic calcifications are noted. The lungs are well aerated without focal infiltrate, effusion or pneumothorax. Bronchitic markings are noted likely of a chronic nature. No focal confluent infiltrate is seen. Bony structures are within normal limits. IMPRESSION: Mild bronchitic changes without focal confluent infiltrate. Electronically Signed   By: Alcide Clever M.D.   On: 11/24/2019 04:00   DG Hip Unilat W or Wo Pelvis 2-3 Views Right  Result Date: 11/23/2019 CLINICAL DATA:  Fall with RIGHT hip deformity. EXAM: DG HIP (WITH OR WITHOUT PELVIS) 2-3V RIGHT COMPARISON:  None FINDINGS: Osteopenia with complete displacement of a RIGHT femoral neck fracture and superior migration of the distal femur relative to the femoral head. Femoral head appears located on submitted views. Degree of comminution may be present in there is sclerosis along the fracture  margin some of this irregularity could be due to overlapping bone. Is and degree of osteopenia. No additional fractures. IMPRESSION: Complete displacement of a RIGHT femoral neck fracture. Superior migration of the distal femur relative to femoral head. Character of the bone is abnormal, potentially related to the degree of osteopenia. Based on appearance pathologic fracture could also be considered. Electronically Signed   By: Donzetta Kohut M.D.   On: 11/23/2019 09:53       The results of significant diagnostics from this hospitalization (including imaging, microbiology, ancillary and laboratory) are listed below for reference.     Microbiology: Recent Results (from the past 240 hour(s))  SARS Coronavirus 2 by RT PCR (hospital order, performed in [facility] hospital lab) Nasopharyngeal Nasopharyngeal Swab     Status: None   Collection Time: 11/23/19  8:41 AM   Specimen: Nasopharyngeal Swab  Result Value Ref Range Status   SARS Coronavirus 2 NEGATIVE NEGATIVE Final    Comment: (NOTE) SARS-CoV-2 target nucleic acids are NOT DETECTED.  The SARS-CoV-2 RNA is generally detectable in upper and lower respiratory specimens  during the acute phase of infection. The lowest concentration of SARS-CoV-2 viral copies this assay can detect is 250 copies / mL. A negative result does not preclude SARS-CoV-2 infection and should not be used as the sole basis for treatment or other patient management decisions.  A negative result may occur with improper specimen collection / handling, submission of specimen other than nasopharyngeal swab, presence of viral mutation(s) within the areas targeted by this assay, and inadequate number of viral copies (<250 copies / mL). A negative result must be combined with clinical observations, patient history, and epidemiological information.  Fact Sheet for Patients:   BoilerBrush.com.cy  Fact Sheet for Healthcare  Providers: https://pope.com/  This test is not yet approved or  cleared by the [place] FDA and has been authorized for detection and/or diagnosis of SARS-CoV-2 by FDA under an Emergency Use Authorization (EUA).  This EUA will remain in effect (meaning this test can be used) for the duration of the COVID-19 declaration under Section 564(b)(1) of the Act, 21 U.S.C. section 360bbb-3(b)(1), unless the authorization is terminated or revoked sooner.  Performed at [facility], [address] [place], [place] [address]   MRSA PCR Screening     Status: None   Collection Time: 11/23/19  7:38 PM   Specimen: Nasopharyngeal  Result Value Ref Range Status   MRSA by PCR NEGATIVE NEGATIVE Final    Comment:        The GeneXpert MRSA Assay (FDA approved for NASAL specimens only), is one component of a comprehensive MRSA colonization surveillance program. It is not intended to diagnose MRSA infection nor to guide or monitor treatment for MRSA infections. Performed at [facility], [address] [place], [place] [address]   CULTURE, BLOOD (ROUTINE X 2) w Reflex to ID Panel     Status: None (Preliminary result)   Collection Time: 11/24/19  4:28 AM   Specimen: BLOOD  Result Value Ref Range Status   Specimen Description BLOOD RIGHT [facility]  Final   Special Requests   Final    BOTTLES DRAWN AEROBIC AND ANAEROBIC Blood Culture adequate volume   Culture   Final    NO GROWTH 4 DAYS Performed at [facility], [address], [place], [place] [address]    Report Status PENDING  Incomplete  CULTURE, BLOOD (ROUTINE X 2) w Reflex to ID Panel     Status: None (Preliminary result)   Collection Time: 11/24/19  4:30 AM   Specimen: BLOOD  Result Value Ref Range Status   Specimen Description BLOOD LEFT [facility]  Final   Special Requests   Final    BOTTLES DRAWN AEROBIC AND ANAEROBIC Blood Culture adequate volume   Culture   Final    NO GROWTH 4  DAYS Performed at [facility], [address] [place], [place] [address]    Report Status PENDING  Incomplete  Urine Culture     Status: None   Collection Time: 11/24/19  5:28 AM   Specimen: Urine, Random  Result Value Ref Range Status   Specimen Description   Final    URINE, RANDOM Performed at [facility], [address], [place], [place] [address]    Special Requests   Final    NONE Performed at [facility], [address], [place], [place] [address]    Culture   Final    NO GROWTH Performed at [facility], [address], [place], [place] [address]    Report  Status 11/25/2019 FINAL  Final  SARS CORONAVIRUS 2 (TAT 6-24 HRS) Nasopharyngeal Nasopharyngeal Swab     Status: None   Collection Time: 11/27/19  6:04 PM   Specimen: Nasopharyngeal Swab  Result Value Ref Range Status   SARS Coronavirus 2 NEGATIVE NEGATIVE Final    Comment: (NOTE) SARS-CoV-2 target nucleic acids are NOT DETECTED.  The SARS-CoV-2 RNA is generally detectable in upper and lower respiratory specimens during the acute phase of infection. Negative results do not preclude SARS-CoV-2 infection, do not rule out co-infections with other pathogens, and should not be used as the sole basis for treatment or other patient management decisions. Negative results must be combined with clinical observations, patient history, and epidemiological information. The expected result is Negative.  Fact Sheet for Patients: HairSlick.nohttps://www.fda.gov/media/138098/download  Fact Sheet for Healthcare Providers: quierodirigir.comhttps://www.fda.gov/media/138095/download  This test is not yet approved or cleared by the [place] FDA and  has been authorized for detection and/or diagnosis of SARS-CoV-2 by FDA under an Emergency Use Authorization (EUA). This EUA will remain  in effect (meaning this test can be used) for the duration of the COVID-19 declaration under Se ction 564(b)(1) of the Act, 21  U.S.C. section 360bbb-3(b)(1), unless the authorization is terminated or revoked sooner.  Performed at [facility], [address] [place], [place] [address]      Labs: BNP (last 3 results) No results for input(s): BNP in the last 8760 hours. Basic Metabolic Panel: Recent Labs  Lab 11/23/19 0840 11/24/19 0428 11/25/19 0430  NA 139 137 137  K 3.9 4.0 3.8  CL 99 102 104  CO2 27 24 23   GLUCOSE 143* 185* 165*  BUN 16 23 24*  CREATININE 0.85 1.05* 0.98  CALCIUM 9.6 8.7* 7.7*  MG  --   --  1.8   Liver Function Tests: Recent Labs  Lab 11/23/19 0840  AST 24  ALT 18  ALKPHOS 80  BILITOT 1.1  PROT 8.7*  ALBUMIN 4.4   No results for input(s): LIPASE, AMYLASE in the last 168 hours. No results for input(s): AMMONIA in the last 168 hours. CBC: Recent Labs  Lab 11/24/19 0428 11/24/19 1819 11/26/19 0447 11/27/19 0557 11/28/19 0658  WBC 12.7* 15.7* 12.0* 10.1 8.1  NEUTROABS  --  13.5*  --   --   --   HGB 14.7 12.9 10.7* 9.9* 9.4*  HCT 42.8 38.2 32.6* 30.0* 27.1*  MCV 91.3 92.5 94.2 94.3 92.2  PLT 183 154 128* 142* 153   Cardiac Enzymes: No results for input(s): CKTOTAL, CKMB, CKMBINDEX, TROPONINI in the last 168 hours. BNP: Invalid input(s): POCBNP CBG: No results for input(s): GLUCAP in the last 168 hours. D-Dimer No results for input(s): DDIMER in the last 72 hours. Hgb A1c Recent Labs    11/27/19 0557  HGBA1C 6.1*   Lipid Profile No results for input(s): CHOL, HDL, LDLCALC, TRIG, CHOLHDL, LDLDIRECT in the last 72 hours. Thyroid function studies No results for input(s): TSH, T4TOTAL, T3FREE, THYROIDAB in the last 72 hours.  Invalid input(s): FREET3 Anemia work up No results for input(s): VITAMINB12, FOLATE, FERRITIN, TIBC, IRON, RETICCTPCT in the last 72 hours. Urinalysis    Component Value Date/Time   COLORURINE YELLOW (A) 11/27/2019 1855   APPEARANCEUR CLEAR (A) 11/27/2019 1855   LABSPEC 1.023 11/27/2019 1855   PHURINE 5.0 11/27/2019 1855    GLUCOSEU NEGATIVE 11/27/2019 1855   HGBUR NEGATIVE 11/27/2019 1855   BILIRUBINUR NEGATIVE 11/27/2019 1855   KETONESUR NEGATIVE 11/27/2019 1855   PROTEINUR  NEGATIVE 11/27/2019 1855   NITRITE NEGATIVE 11/27/2019 1855   LEUKOCYTESUR TRACE (A) 11/27/2019 1855   Sepsis Labs Invalid input(s): PROCALCITONIN,  WBC,  LACTICIDVEN   Time coordinating discharge: 55 minutes  SIGNED:  Almon Hercules, MD  [facility] 11/28/2019, 2:10 PM  If 7PM-7AM, please contact night-coverage [email or web address] Password [email or web address]

## 2019-11-28 NOTE — TOC Progression Note (Signed)
Transition of Care [facility]) - Progression Note    Patient Details  Name: Jane Bailey MRN: [id number] Date of Birth: [birth year]  [facility] [place] CM/SW Contact  Barrie Dunker, RN Phone Number: 11/28/2019, 3:24 PM  Clinical Narrative:    Bedside Nurse called [facility], TOC, CM called EMS for transport, the patient is 3rd on the list   Expected Discharge Plan: Skilled Nursing Facility Barriers to Discharge: Continued Medical Work up  Expected Discharge Plan and Services Expected Discharge Plan: Skilled Nursing Facility In-house Referral: Clinical Social Work   Post Acute Care Choice: Skilled Nursing Facility Living arrangements for the past 2 months: Skilled Nursing Facility Expected Discharge Date: 11/28/19                                     Social Determinants of Health (SDOH) Interventions    Readmission Risk Interventions No flowsheet data found.

## 2019-11-28 NOTE — Progress Notes (Signed)
  Subjective: 4 Days Post-Op Procedure(s) (LRB): ARTHROPLASTY BIPOLAR HIP (HEMIARTHROPLASTY) (Right) Patient reports pain as mild.   Patient is well, and has had no acute complaints or problems Plan is to go Rehab after hospital stay. Negative for chest pain and shortness of breath Fever: no Gastrointestinal: Negative for nausea and vomiting  Objective: Vital signs in last 24 hours: Temp:  [97.8 F (36.6 C)-99.8 F (37.7 C)] 99.8 F (37.7 C) (08/04 2332) Pulse Rate:  [34-92] 49 (08/04 2332) Resp:  [16-18] 16 (08/04 2332) BP: (108-141)/(41-58) 131/49 (08/04 2332) SpO2:  [91 %-99 %] 93 % (08/04 2332)  Intake/Output from previous day:  Intake/Output Summary (Last 24 hours) at 11/28/2019 0706 Last data filed at 11/28/2019 0507 Gross per 24 hour  Intake 2445 ml  Output 1566 ml  Net 879 ml    Intake/Output this shift: No intake/output data recorded.  Labs: Recent Labs    11/26/19 0447 11/27/19 0557  HGB 10.7* 9.9*   Recent Labs    11/26/19 0447 11/27/19 0557  WBC 12.0* 10.1  RBC 3.46* 3.18*  HCT 32.6* 30.0*  PLT 128* 142*   No results for input(s): NA, K, CL, CO2, BUN, CREATININE, GLUCOSE, CALCIUM in the last 72 hours. No results for input(s): LABPT, INR in the last 72 hours.   EXAM General - Patient is Alert and Confused Extremity - Neurovascular intact Sensation intact distally Dorsiflexion/Plantar flexion intact Compartment soft Dressing/Incision - clean, dry, no drainage Motor Function - intact, moving foot and toes well on exam.  Transfers with physical therapy  Past Medical History:  Diagnosis Date  . Dementia (HCC) 11/23/2019   Per daughter  . Fall 11/23/2019    Assessment/Plan: 4 Days Post-Op Procedure(s) (LRB): ARTHROPLASTY BIPOLAR HIP (HEMIARTHROPLASTY) (Right) Principal Problem:   Closed displaced fracture of right femoral neck (HCC) Active Problems:   Fall   Dementia (HCC)   Bradycardia  Estimated body mass index is 23.68 kg/m as  calculated from the following:   Height as of this encounter: 5\' 6"  (1.676 m).   Weight as of this encounter: 66.5 kg. Advance diet Up with therapy D/C IV fluids  Follow-up at [facility] clinic orthopedics in 2 weeks for x-rays of the right hip and staple removal  DVT Prophylaxis - Lovenox, Foot Pumps and TED hose Weight-Bearing as tolerated to right leg  [facility], PA-C Orthopaedic Surgery 11/28/2019, 7:06 AM

## 2019-11-28 NOTE — Plan of Care (Signed)

## 2019-11-28 NOTE — Progress Notes (Signed)
Physical Therapy Treatment Patient Details Name: Jane Bailey MRN: [id number] DOB: 05/16/1930 Today's Date: 11/28/2019    History of Present Illness Pt is a 86 y.o. female with medical history significant of dementia. Pt presents s/p a fall. Per MD impression, pt presents w/ complete displacement of R femoral neck fx and is now s/p R hip hemiarthroplasty 8/1; as well as bradycardia w/ possible Mobitz type I AV block.    PT Comments    Pt was long sitting in bed with daughter/son present. She is alert but has baseline dementia. Unable to recall hip precautions. She request to urinate. Pt performed roll L to short sit with max assist + increased time and cueing. Stood pivot to [facility] then to recliner with max assist. Overall tolerated well but does endorse pain in wt bearing. Pt will benefit from SNF at DC to address deficits and improve safe functional mobility. LPt was seated in recliner with son/daughter in room. Therapist will return to assist pt back to bed after lunch.    Follow Up Recommendations  SNF;Supervision/Assistance - 24 hour     Equipment Recommendations  None recommended by PT (defer to next level of care)    Recommendations for Other Services       Precautions / Restrictions Precautions Precautions: Fall;Posterior Hip Precaution Booklet Issued: Yes (comment) Precaution Comments: pt unable to recall hip precautions Restrictions Weight Bearing Restrictions: No RLE Weight Bearing: Weight bearing as tolerated    Mobility  Bed Mobility Overal bed mobility: Needs Assistance Bed Mobility: Rolling;Sidelying to Sit Rolling: Mod assist Sidelying to sit: Max assist       General bed mobility comments: mod assist with use of bed rail to roll L. max assist to achieve EOB short sit with max vcs and tactle cues. Increased time to perform and max assist.  Transfers Overall transfer level: Needs assistance Equipment used: None (for improved safety, performed without AD  ) Transfers: Sit to/from [employer] Sit to Stand: Max assist Stand pivot transfers: Max assist       General transfer comment: pt requires amx assist for transfers. performed without AD for added safety  Ambulation/Gait         Gait velocity: Unable   General Gait Details: unable/unsafe to trial   Stairs             Wheelchair Mobility    Modified Rankin (Stroke Patients Only)       Balance Overall balance assessment: Needs assistance Sitting-balance support: Feet supported;Bilateral upper extremity supported Sitting balance-Leahy Scale: Fair Sitting balance - Comments: CGA-SBA for safety to maintain sitting balance EOB.   Standing balance support: Bilateral upper extremity supported Standing balance-Leahy Scale: Poor Standing balance comment: max assist to maintain standing balance                             Cognition Arousal/Alertness: Awake/alert Behavior During Therapy: Flat affect Overall Cognitive Status: History of cognitive impairments - at baseline Area of Impairment: Safety/judgement;Awareness;Problem solving;Orientation;Attention;Memory;Following commands                 Orientation Level: Person Current Attention Level: Alternating Memory: Decreased recall of precautions;Decreased short-term memory Following Commands: Follows one step commands consistently;Follows one step commands with increased time Safety/Judgement: Decreased awareness of safety;Decreased awareness of deficits   Problem Solving: Slow processing;Difficulty sequencing;Decreased initiation;Requires verbal cues;Requires tactile cues        Exercises      General  Comments        Pertinent Vitals/Pain Pain Assessment:  (does not formally rate but endorses pain with movements) Faces Pain Scale: Hurts little more Pain Location: RLE  Pain Descriptors / Indicators: Grimacing;Operative site guarding;Moaning Pain Intervention(s): Limited  activity within patient's tolerance;Monitored during session;Premedicated before session;Repositioned;Ice applied    Home Living                      Prior Function            PT Goals (current goals can now be found in the care plan section) Acute Rehab PT Goals Patient Stated Goal: none stated Progress towards PT goals: Progressing toward goals    Frequency    7X/week      PT Plan Current plan remains appropriate    Co-evaluation              AM-PAC PT "6 Clicks" Mobility   Outcome Measure  Help needed turning from your back to your side while in a flat bed without using bedrails?: A Lot Help needed moving from lying on your back to sitting on the side of a flat bed without using bedrails?: A Lot Help needed moving to and from a bed to a chair (including a wheelchair)?: A Lot Help needed standing up from a chair using your arms (e.g., wheelchair or bedside chair)?: A Lot Help needed to walk in hospital room?: Total Help needed climbing 3-5 steps with a railing? : Total 6 Click Score: 10    End of Session Equipment Utilized During Treatment: Gait belt Activity Tolerance: Patient tolerated treatment well;Patient limited by fatigue;Patient limited by pain Patient left: in chair;with call bell/phone within reach;with chair alarm set;with family/visitor present Nurse Communication: Mobility status PT Visit Diagnosis: Unsteadiness on feet (R26.81);Muscle weakness (generalized) (M62.81);Other abnormalities of gait and mobility (R26.89);Pain Pain - Right/Left: Right Pain - part of body: Hip     Time: 1100-1130 PT Time Calculation (min) (ACUTE ONLY): 30 min  Charges:  $Therapeutic Activity: 23-37 mins                     Jetta Lout PTA 11/28/19, 12:22 PM

## 2019-11-29 LAB — CULTURE, BLOOD (ROUTINE X 2)
Culture: NO GROWTH
Culture: NO GROWTH
Special Requests: ADEQUATE
Special Requests: ADEQUATE

## 2019-12-10 ENCOUNTER — Ambulatory Visit: Payer: Medicare Other | Admitting: Urology

## 2019-12-16 ENCOUNTER — Ambulatory Visit: Admit: 2019-12-16 | Payer: Medicare Other | Admitting: Ophthalmology

## 2019-12-16 SURGERY — PHACOEMULSIFICATION, CATARACT, WITH IOL INSERTION
Anesthesia: General | Laterality: Right

## 2019-12-17 ENCOUNTER — Ambulatory Visit: Payer: Medicare Other | Admitting: Urology

## 2020-12-13 ENCOUNTER — Other Ambulatory Visit: Payer: Self-pay

## 2020-12-13 ENCOUNTER — Emergency Department
Admission: EM | Admit: 2020-12-13 | Discharge: 2020-12-13 | Disposition: A | Discharge status: Home / Self Care | Payer: Medicare Other | Attending: Emergency Medicine | Admitting: Emergency Medicine

## 2020-12-13 DIAGNOSIS — F0390 Unspecified dementia without behavioral disturbance: Secondary | ICD-10-CM | POA: Diagnosis not present

## 2020-12-13 DIAGNOSIS — R040 Epistaxis: Secondary | ICD-10-CM | POA: Diagnosis not present

## 2020-12-13 DIAGNOSIS — Z96641 Presence of right artificial hip joint: Secondary | ICD-10-CM | POA: Insufficient documentation

## 2020-12-13 MED ORDER — TRANEXAMIC ACID FOR EPISTAXIS
500.0000 mg | Freq: Once | TOPICAL | Status: AC
  Administered 2020-12-13: 500 mg via TOPICAL
  Filled 2020-12-13: qty 10

## 2020-12-13 MED ORDER — LORAZEPAM 0.5 MG PO TABS
0.5000 mg | ORAL_TABLET | Freq: Once | ORAL | Status: DC
  Filled 2020-12-13: qty 1

## 2020-12-13 NOTE — ED Provider Notes (Signed)
[facility] Emergency Department Provider Note  ____________________________________________   Event Date/Time   First MD Initiated Contact with Patient 12/13/20 [phone]     (approximate)  I have reviewed the triage vital signs and the nursing notes.   HISTORY  Chief Complaint Epistaxis    HPI Jane Bailey is a 87 y.o. female presents emergency department with a nosebleed.  Patient states that nosebleed started today.  Patient has history of dementia and does not realize that she is at the hospital.  Patient arrived via EMS.  EMS gave Afrin in route.  Past Medical History:  Diagnosis Date   Dementia (HCC) 11/23/2019   Per daughter   Fall 11/23/2019    Patient Active Problem List   Diagnosis Date Noted   Fall 11/23/2019   Dementia (HCC) 11/23/2019   Closed displaced fracture of right femoral neck (HCC) 11/23/2019   Bradycardia 11/23/2019    Past Surgical History:  Procedure Laterality Date   ABDOMINAL HYSTERECTOMY     CHOLECYSTECTOMY     HIP ARTHROPLASTY Right 11/24/2019   Procedure: ARTHROPLASTY BIPOLAR HIP (HEMIARTHROPLASTY);  Surgeon: Signa Kell, MD;  Location: [facility] ORS;  Service: Orthopedics;  Laterality: Right;    Prior to Admission medications   Medication Sig Start Date End Date Taking? Authorizing Provider  acetaminophen (TYLENOL) 500 MG tablet Take 2 tablets (1,000 mg total) by mouth every 8 (eight) hours. 11/28/19   Almon Hercules, MD  bethanechol (URECHOLINE) 10 MG tablet Take 1 tablet (10 mg total) by mouth 3 (three) times daily. 11/28/19   Almon Hercules, MD  bisacodyl (DULCOLAX) 10 MG suppository Place 1 suppository (10 mg total) rectally daily as needed for moderate constipation. 11/28/19   Almon Hercules, MD  docusate sodium (COLACE) 100 MG capsule Take 1 capsule (100 mg total) by mouth 2 (two) times daily. 11/28/19   Almon Hercules, MD  enoxaparin (LOVENOX) 40 MG/0.4ML injection Inject 0.4 mLs (40 mg total) into the skin daily for 14  doses. 11/25/19 12/09/19  [name], PA-C  feeding supplement, ENSURE ENLIVE, (ENSURE ENLIVE) LIQD Take 237 mLs by mouth 2 (two) times daily between meals. 11/28/19   Almon Hercules, MD  Multiple Vitamins-Minerals (CERTA PLUS SENIOR PO) Take 1 tablet by mouth daily.    [provider]  [employer] powder Apply 1 application topically in the morning and at bedtime. 10/22/19   [provider]  ondansetron (ZOFRAN) 4 MG tablet Take 1 tablet (4 mg total) by mouth every 6 (six) hours as needed for nausea. 11/28/19   Almon Hercules, MD  oxyCODONE (OXY IR/ROXICODONE) 5 MG immediate release tablet Take 1 tablet (5 mg total) by mouth every 4 (four) hours as needed for severe pain. 11/25/19   [name], PA-C    Allergies Patient has no known allergies.  Family History  Problem Relation Age of Onset   Heart attack Father    Heart attack Brother     Social History Social History   Tobacco Use   Smoking status: Never   Smokeless tobacco: Never  Vaping Use   Vaping Use: Never used  Substance Use Topics   Alcohol use: Never   Drug use: Never    Review of Systems Patient is poor historian due to dementia Constitutional: No fever/chills Eyes: No visual changes. ENT: No sore throat. Respiratory: Denies cough Cardiovascular: Denies chest pain Gastrointestinal: Denies abdominal pain Genitourinary: Negative for dysuria. Musculoskeletal: Negative for back pain. Skin: Negative for rash. Psychiatric:  no mood changes,     ____________________________________________   PHYSICAL EXAM:  VITAL SIGNS: ED Triage Vitals [12/13/20 0909]  Enc Vitals Group     BP (!) 168/88     Pulse Rate 80     Resp 18     Temp 98.5 F (36.9 C)     Temp Source Oral     SpO2 96 %     Weight 150 lb (68 kg)     Height 5\' 8"  (1.727 m)     Head Circumference      Peak Flow      Pain Score 0     Pain Loc      Pain Edu?      Excl. in GC?     Constitutional: Alert and oriented. Well appearing and in  no acute distress. Eyes: Conjunctivae are normal.  Head: Atraumatic. Nose: No congestion/rhinnorhea.  Active bleeding noted from the right nostril, anteriorly Mouth/Throat: Mucous membranes are moist.   Neck:  supple no lymphadenopathy noted Cardiovascular: Normal rate, regular rhythm. Heart sounds are normal Respiratory: Normal respiratory effort.  No retractions, lungs c t a  GU: deferred Musculoskeletal: FROM all extremities, warm and well perfused Neurologic:  Normal speech and language.  Skin:  Skin is warm, dry and intact. No rash noted. Psychiatric: Mood and affect are normal. Speech and behavior are normal.  ____________________________________________   LABS (all labs ordered are listed, but only abnormal results are displayed)  Labs Reviewed - No data to display ____________________________________________   ____________________________________________  RADIOLOGY    ____________________________________________   PROCEDURES  Procedure(s) performed: No  Procedures    ____________________________________________   INITIAL IMPRESSION / ASSESSMENT AND PLAN / ED COURSE  Pertinent labs & imaging results that were available during my care of the patient were reviewed by me and considered in my medical decision making (see chart for details).   Patient is 87 year old female presents with nosebleed.  See HPI.  Physical exam shows patient per stable.  Active bleeding noted from the right near.  Appears to be anterior.  Afrin was applied via EMS prior to arrival.  We will place nose clamps.  Keeps removing the nose clamps.  Applied TXA.  Several attempts to have the patient keep the nose clamps on.  We finally were able to have her leave the nose clamps along and not pick at her nose long enough for the area to heal.  Dr. 98 was in to see to help.  We gave her Ativan to calm her down and hopes of her not picking up the news.  She is discharged in stable condition.   Return if worsening.  Jane Bailey was evaluated in Emergency Department on 12/13/2020 for the symptoms described in the history of present illness. She was evaluated in the context of the global COVID-19 pandemic, which necessitated consideration that the patient might be at risk for infection with the SARS-CoV-2 virus that causes COVID-19. Institutional protocols and algorithms that pertain to the evaluation of patients at risk for COVID-19 are in a state of rapid change based on information released by regulatory bodies including the CDC and federal and state organizations. These policies and algorithms were followed during the patient's care in the ED.    As part of my medical decision making, I reviewed the following data within the electronic MEDICAL RECORD NUMBER Nursing notes reviewed and incorporated, Old chart reviewed, Notes from prior ED visits, and [place] Controlled Substance Database  ____________________________________________  FINAL CLINICAL IMPRESSION(S) / ED DIAGNOSES  Final diagnoses:  Epistaxis      NEW MEDICATIONS STARTED DURING THIS VISIT:  New Prescriptions   No medications on file     Note:  This document was prepared using Dragon voice recognition software and may include unintentional dictation errors.    Faythe Ghee, PA-C 12/13/20 1150    Gilles Chiquito, MD 12/13/20 [phone]

## 2020-12-13 NOTE — ED Triage Notes (Signed)
Pt arrives via EMS from [facility] for a nose bleed- pt was given afrin- pt not having active bleeding

## 2020-12-13 NOTE — ED Notes (Signed)
Spoke with daughter  Update given

## 2020-12-13 NOTE — Discharge Instructions (Addendum)
Try to have the patient avoid touching her nose.  The area has healed at this time.  If she worsens and starts to bleed again bring her to the emergency department.  You can also apply a small amount of Vaseline in her nose at night to prevent it from cracking.

## 2020-12-13 NOTE — ED Triage Notes (Signed)
Patient arrived by EMS from [facility]. C/o nosebleed that started this AM. Was given afrin by medic unit and gauze and no bleeding upon arrival. Also reports flare up of hemorrhoids this AM. HX dementia

## 2021-04-21 ENCOUNTER — Inpatient Hospital Stay
Admission: EM | Admit: 2021-04-21 | Discharge: 2021-04-23 | DRG: 228 | Disposition: A | Discharge status: Skilled Nursing Facility | Payer: Medicare Other | Source: Skilled Nursing Facility | Attending: Family Medicine | Admitting: Family Medicine

## 2021-04-21 ENCOUNTER — Other Ambulatory Visit: Payer: Self-pay

## 2021-04-21 ENCOUNTER — Encounter: Payer: Self-pay | Admitting: *Deleted

## 2021-04-21 ENCOUNTER — Emergency Department: Payer: Medicare Other

## 2021-04-21 DIAGNOSIS — G9341 Metabolic encephalopathy: Secondary | ICD-10-CM | POA: Diagnosis present

## 2021-04-21 DIAGNOSIS — I442 Atrioventricular block, complete: Secondary | ICD-10-CM | POA: Diagnosis not present

## 2021-04-21 DIAGNOSIS — Z6822 Body mass index (BMI) 22.0-22.9, adult: Secondary | ICD-10-CM

## 2021-04-21 DIAGNOSIS — Z006 Encounter for examination for normal comparison and control in clinical research program: Secondary | ICD-10-CM

## 2021-04-21 DIAGNOSIS — E876 Hypokalemia: Secondary | ICD-10-CM | POA: Diagnosis not present

## 2021-04-21 DIAGNOSIS — F0390 Unspecified dementia without behavioral disturbance: Secondary | ICD-10-CM | POA: Diagnosis present

## 2021-04-21 DIAGNOSIS — Z888 Allergy status to other drugs, medicaments and biological substances status: Secondary | ICD-10-CM

## 2021-04-21 DIAGNOSIS — I248 Other forms of acute ischemic heart disease: Secondary | ICD-10-CM | POA: Diagnosis present

## 2021-04-21 DIAGNOSIS — Z79899 Other long term (current) drug therapy: Secondary | ICD-10-CM

## 2021-04-21 DIAGNOSIS — F03C2 Unspecified dementia, severe, with psychotic disturbance: Secondary | ICD-10-CM | POA: Diagnosis present

## 2021-04-21 DIAGNOSIS — R64 Cachexia: Secondary | ICD-10-CM | POA: Diagnosis present

## 2021-04-21 DIAGNOSIS — R001 Bradycardia, unspecified: Secondary | ICD-10-CM | POA: Diagnosis present

## 2021-04-21 DIAGNOSIS — Z20822 Contact with and (suspected) exposure to covid-19: Secondary | ICD-10-CM | POA: Diagnosis present

## 2021-04-21 DIAGNOSIS — N390 Urinary tract infection, site not specified: Secondary | ICD-10-CM | POA: Diagnosis present

## 2021-04-21 DIAGNOSIS — D72829 Elevated white blood cell count, unspecified: Secondary | ICD-10-CM | POA: Diagnosis present

## 2021-04-21 DIAGNOSIS — Z8249 Family history of ischemic heart disease and other diseases of the circulatory system: Secondary | ICD-10-CM

## 2021-04-21 LAB — COMPREHENSIVE METABOLIC PANEL
ALT: 23 U/L (ref 0–44)
AST: 25 U/L (ref 15–41)
Albumin: 3.3 g/dL — ABNORMAL LOW (ref 3.5–5.0)
Alkaline Phosphatase: 76 U/L (ref 38–126)
Anion gap: 5 (ref 5–15)
BUN: 18 mg/dL (ref 8–23)
CO2: 21 mmol/L — ABNORMAL LOW (ref 22–32)
Calcium: 8.4 mg/dL — ABNORMAL LOW (ref 8.9–10.3)
Chloride: 109 mmol/L (ref 98–111)
Creatinine, Ser: 0.67 mg/dL (ref 0.44–1.00)
GFR, Estimated: >60 mL/min (ref >60)
Glucose, Bld: 139 mg/dL — ABNORMAL HIGH (ref 70–99)
Potassium: 3.3 mmol/L — ABNORMAL LOW (ref 3.5–5.1)
Sodium: 135 mmol/L (ref 135–145)
Total Bilirubin: 1 mg/dL (ref 0.3–1.2)
Total Protein: 7.4 g/dL (ref 6.5–8.1)

## 2021-04-21 LAB — CBC
HCT: 36.3 % (ref 36.0–46.0)
Hemoglobin: 11.8 g/dL — ABNORMAL LOW (ref 12.0–15.0)
MCH: 29.9 pg (ref 26.0–34.0)
MCHC: 32.5 g/dL (ref 30.0–36.0)
MCV: 91.9 fL (ref 80.0–100.0)
Platelets: 204 10*3/uL (ref 150–400)
RBC: 3.95 MIL/uL (ref 3.87–5.11)
RDW: 14 % (ref 11.5–15.5)
WBC: 11.4 10*3/uL — ABNORMAL HIGH (ref 4.0–10.5)
nRBC: 0 % (ref 0.0–0.2)

## 2021-04-21 LAB — LIPASE, BLOOD: Lipase: 28 U/L (ref 11–51)

## 2021-04-21 LAB — TROPONIN I (HIGH SENSITIVITY): Troponin I (High Sensitivity): 150 ng/L (ref <18)

## 2021-04-21 MED ORDER — ONDANSETRON HCL 4 MG/2ML IJ SOLN: 4.0000 mg | Freq: Four times a day (QID) | INTRAMUSCULAR | Status: DC | PRN

## 2021-04-21 MED ORDER — ACETAMINOPHEN 325 MG RE SUPP: 650.0000 mg | Freq: Four times a day (QID) | RECTAL | Status: DC | PRN

## 2021-04-21 MED ORDER — ENSURE ENLIVE PO LIQD: 237.0000 mL | Freq: Two times a day (BID) | ORAL | Status: DC

## 2021-04-21 MED ORDER — BISACODYL 10 MG RE SUPP
10.0000 mg | Freq: Every day | RECTAL | Status: DC | PRN
  Filled 2021-04-21: qty 1

## 2021-04-21 MED ORDER — ONDANSETRON HCL 4 MG PO TABS: 4.0000 mg | ORAL_TABLET | Freq: Four times a day (QID) | ORAL | Status: DC | PRN

## 2021-04-21 MED ORDER — ACETAMINOPHEN 500 MG PO TABS: 1000.0000 mg | ORAL_TABLET | Freq: Four times a day (QID) | ORAL | Status: DC | PRN

## 2021-04-21 MED ORDER — HYDROCERIN EX CREA
TOPICAL_CREAM | CUTANEOUS | Status: DC | PRN
  Filled 2021-04-21: qty 113

## 2021-04-21 MED ORDER — ENOXAPARIN SODIUM 40 MG/0.4ML IJ SOSY: 40.0000 mg | PREFILLED_SYRINGE | Freq: Every day | INTRAMUSCULAR | Status: DC

## 2021-04-21 MED ORDER — POTASSIUM CHLORIDE 10 MEQ/100ML IV SOLN
10.0000 meq | INTRAVENOUS | Status: AC
  Administered 2021-04-21 – 2021-04-22 (×2): 10 meq via INTRAVENOUS
  Filled 2021-04-21 (×2): qty 100

## 2021-04-21 MED ORDER — POTASSIUM CHLORIDE CRYS ER 20 MEQ PO TBCR
40.0000 meq | EXTENDED_RELEASE_TABLET | Freq: Once | ORAL | Status: AC
  Administered 2021-04-21: 40 meq via ORAL
  Filled 2021-04-21: qty 2

## 2021-04-21 NOTE — H&P (Addendum)
History and Physical   Jane Bailey UKG:[id number] DOB: [birth year] DOA: 04/21/2021  PCP: Drue Flirt, MD  Patient coming from: [facility]  I have personally briefly reviewed patient's old medical records in [facility] EMR.  Chief Concern: Bradycardia  HPI: Jane Bailey is a 87 y.o. female with medical history significant for bradycardia, dementia, who presents emergency department for chief concerns of low heart rate.  At bedside patient was able to tell me that her name is Jane Bailey.  She was not able to tell me her age, she was not able to tell me her current location, and she does not know the current calendar year.  She does not appear to be in acute distress.  She denies hurting anywhere.  She specifically denies any chest pain or shortness of breath.  Social history: She lives in a nursing facility.  ROS: Unable to complete due to advanced dementia  ED Course: Discussed with emergency medicine provider, patient requiring hospitalization for chief concerns of tachycardia.  Vitals in the emergency department showed temperature of 98, respiration rate of 20, heart rate of 37, blood pressure 118/65, SPO2 of 95% on room air.  Labs in the emergency department showed serum sodium 135, potassium 3.3, chloride 109, bicarb 21, BUN of 18, serum creatinine of 0.67, nonfasting blood glucose 139, GFR greater than 60, leukocytosis of 11.4, hemoglobin 11.8, platelets of 204.  In the emergency department patient was given sodium chloride 40 mill equivalent, potassium chloride 10 mill equivalent IV x2.  EDP discussed patient with cardiology, Dr. Gwen Pounds who states patient does not need to be paced at this time due to asymptomatic.  EDP states that family is amendable to pacemaker placement.  Assessment/Plan  Principal Problem:   Symptomatic bradycardia Active Problems:   Dementia (HCC)   Bradycardia   Leukocytosis   # Bradycardia-cardiology has been consulted - Admit to  progressive cardiac, observation, telemetry - Appreciate further recommendations from cardiology - I reviewed EKG on 11/23/2019, which showed first to second-degree heart block, with rate of 41, QTc of 431 - Check TSH, ordered as an add-on to prior lab collection, called lab and discussed - Phosphorus check  # NSTEMI - heparin gtt ordered, complete echo ordered  # Hypokalemia-status post replacement per EDP with potassium chloride 10 mill equivalent x2 by IV, potassium chloride 40 mill equivalent p.o. - Check magnesium - BMP in the a.m. Dementia  # Leukocytosis-etiology work-up in progress, patient does not meet sepsis criteria - Check UA - CBC in the a.m.  Chart reviewed.   DVT prophylaxis: Enoxaparin ordered for nightly on 04/22/2021 Code Status: Full code Diet: N.p.o. after midnight Family Communication: No Disposition Plan: Pending clinical course Consults called: Cardiology Admission status: Progressive cardiac, observation  Past Medical History:  Diagnosis Date   Dementia (HCC) 11/23/2019   Per daughter   Fall 11/23/2019   Past Surgical History:  Procedure Laterality Date   ABDOMINAL HYSTERECTOMY     CHOLECYSTECTOMY     HIP ARTHROPLASTY Right 11/24/2019   Procedure: ARTHROPLASTY BIPOLAR HIP (HEMIARTHROPLASTY);  Surgeon: Signa Kell, MD;  Location: [facility] ORS;  Service: Orthopedics;  Laterality: Right;   Social History:  reports that she has never smoked. She has never used smokeless tobacco. She reports that she does not drink alcohol and does not use drugs.  Allergies  Allergen Reactions   Fosamax [Alendronate]    Family History  Problem Relation Age of Onset   Heart attack Father    Heart attack Brother  Family history: Family history reviewed and not pertinent  Prior to Admission medications   Medication Sig Start Date End Date Taking? Authorizing Provider  acetaminophen (TYLENOL) 500 MG tablet Take 2 tablets (1,000 mg total) by mouth every 8 (eight)  hours. 11/28/19   Almon Hercules, MD  bethanechol (URECHOLINE) 10 MG tablet Take 1 tablet (10 mg total) by mouth 3 (three) times daily. 11/28/19   Almon Hercules, MD  bisacodyl (DULCOLAX) 10 MG suppository Place 1 suppository (10 mg total) rectally daily as needed for moderate constipation. 11/28/19   Almon Hercules, MD  docusate sodium (COLACE) 100 MG capsule Take 1 capsule (100 mg total) by mouth 2 (two) times daily. 11/28/19   Almon Hercules, MD  enoxaparin (LOVENOX) 40 MG/0.4ML injection Inject 0.4 mLs (40 mg total) into the skin daily for 14 doses. 11/25/19 12/09/19  [name], PA-C  feeding supplement, ENSURE ENLIVE, (ENSURE ENLIVE) LIQD Take 237 mLs by mouth 2 (two) times daily between meals. 11/28/19   Almon Hercules, MD  Multiple Vitamins-Minerals (CERTA PLUS SENIOR PO) Take 1 tablet by mouth daily.    [provider]  [facility] powder Apply 1 application topically in the morning and at bedtime. 10/22/19   [provider]  ondansetron (ZOFRAN) 4 MG tablet Take 1 tablet (4 mg total) by mouth every 6 (six) hours as needed for nausea. 11/28/19   Almon Hercules, MD  oxyCODONE (OXY IR/ROXICODONE) 5 MG immediate release tablet Take 1 tablet (5 mg total) by mouth every 4 (four) hours as needed for severe pain. 11/25/19   [name], PA-C   Physical Exam: Vitals:   04/21/21 2141 04/21/21 2159  BP: 118/65   Pulse: (!) 123 (!) 37  Resp: 20   Temp: 98 F (36.7 C)   TempSrc: Oral   SpO2: 95%   Weight: 68 kg   Height: 5\' 8"  (1.727 m)    Constitutional: appears age-appropriate, frail, cachectic, NAD, calm, comfortable Eyes: PERRL, lids and conjunctivae normal ENMT: Mucous membranes are moist. Posterior pharynx clear of any exudate or lesions. Age-appropriate dentition. Hearing appropriate Neck: normal, supple, no masses, no thyromegaly Respiratory: clear to auscultation bilaterally, no wheezing, no crackles. Normal respiratory effort. No accessory muscle use.  Cardiovascular: Regular rate and  rhythm, no murmurs / rubs / gallops. No extremity edema. 2+ pedal pulses. No carotid bruits.  Abdomen: no tenderness, no masses palpated, no hepatosplenomegaly. Bowel sounds positive.  Musculoskeletal: no clubbing / cyanosis. No joint deformity upper and lower extremities. Good ROM, no contractures, no atrophy. Normal muscle tone.  Skin: no rashes, lesions, ulcers. No induration Neurologic: Sensation intact. Strength 5/5 in all 4.  Psychiatric: Normal judgment and insight.  Awake and alert to self. Normal mood.   EKG: independently reviewed, showing Sinus bradycardia with rate of 37, QTc 580  Chest x-ray on Admission: I personally reviewed and I agree with radiologist reading as below.  DG Chest 1 View  Result Date: 04/21/2021 CLINICAL DATA:  Nausea and vomiting and bradycardia. EXAM: CHEST  1 VIEW COMPARISON:  Chest radiograph dated 11/24/2019. FINDINGS: There is diffuse chronic interstitial coarsening. No focal consolidation, pleural effusion, pneumothorax. The cardiac silhouette is within limits. Atherosclerotic calcification of the aorta. Osteopenia with degenerative changes of the spine and shoulders. No acute osseous pathology. IMPRESSION: No active cardiopulmonary disease. Electronically Signed   By: 01/24/2020 M.D.   On: 04/21/2021 22:25    Labs on Admission: I have personally reviewed following labs  CBC: Recent  Labs  Lab 04/21/21 2147  WBC 11.4*  HGB 11.8*  HCT 36.3  MCV 91.9  PLT 204   Basic Metabolic Panel: Recent Labs  Lab 04/21/21 2147  NA 135  K 3.3*  CL 109  CO2 21*  GLUCOSE 139*  BUN 18  CREATININE 0.67  CALCIUM 8.4*   GFR: Estimated Creatinine Clearance: 49 mL/min (by C-G formula based on SCr of 0.67 mg/dL).  Liver Function Tests: Recent Labs  Lab 04/21/21 2147  AST 25  ALT 23  ALKPHOS 76  BILITOT 1.0  PROT 7.4  ALBUMIN 3.3*   Recent Labs  Lab 04/21/21 2147  LIPASE 28   Urine analysis:    Component Value Date/Time   COLORURINE  YELLOW (A) 11/27/2019 1855   APPEARANCEUR CLEAR (A) 11/27/2019 1855   LABSPEC 1.023 11/27/2019 1855   PHURINE 5.0 11/27/2019 1855   GLUCOSEU NEGATIVE 11/27/2019 1855   HGBUR NEGATIVE 11/27/2019 1855   BILIRUBINUR NEGATIVE 11/27/2019 1855   KETONESUR NEGATIVE 11/27/2019 1855   PROTEINUR NEGATIVE 11/27/2019 1855   NITRITE NEGATIVE 11/27/2019 1855   LEUKOCYTESUR TRACE (A) 11/27/2019 1855   CRITICAL CARE Performed by: Nadyne Coombes Altair Stanko  Total critical care time: 35 minutes  Critical care time was exclusive of separately billable procedures and treating other patients.  Critical care was necessary to treat or prevent imminent or life-threatening deterioration.   Critical care was time spent personally by me on the following activities: development of treatment plan with patient and/or surrogate as well as nursing, discussions with consultants, evaluation of patient's response to treatment, examination of patient, obtaining history from patient or surrogate, ordering and performing treatments and interventions, ordering and review of laboratory studies, ordering and review of radiographic studies, pulse oximetry and re-evaluation of patient's condition.  Dr. Sedalia Muta [facility]  If 7PM-7AM, please contact overnight-coverage provider If 7AM-7PM, please contact day coverage provider [email or web address]  04/21/2021, 11:52 PM

## 2021-04-21 NOTE — ED Provider Notes (Incomplete)
[facility] Emergency Department Provider Note  ____________________________________________   Event Date/Time   First MD Initiated Contact with Patient 04/21/21 2231     (approximate)  I have reviewed the triage vital signs and the nursing notes.   HISTORY  Chief Complaint No chief complaint on file.    HPI Jane Bailey is a 87 y.o. female  ***        Past Medical History:  Diagnosis Date   Dementia (HCC) 11/23/2019   Per daughter   Fall 11/23/2019    Patient Active Problem List   Diagnosis Date Noted   Fall 11/23/2019   Dementia (HCC) 11/23/2019   Closed displaced fracture of right femoral neck (HCC) 11/23/2019   Bradycardia 11/23/2019    Past Surgical History:  Procedure Laterality Date   ABDOMINAL HYSTERECTOMY     CHOLECYSTECTOMY     HIP ARTHROPLASTY Right 11/24/2019   Procedure: ARTHROPLASTY BIPOLAR HIP (HEMIARTHROPLASTY);  Surgeon: Signa Kell, MD;  Location: [facility] ORS;  Service: Orthopedics;  Laterality: Right;    Prior to Admission medications   Medication Sig Start Date End Date Taking? Authorizing Provider  acetaminophen (TYLENOL) 500 MG tablet Take 2 tablets (1,000 mg total) by mouth every 8 (eight) hours. 11/28/19   Almon Hercules, MD  bethanechol (URECHOLINE) 10 MG tablet Take 1 tablet (10 mg total) by mouth 3 (three) times daily. 11/28/19   Almon Hercules, MD  bisacodyl (DULCOLAX) 10 MG suppository Place 1 suppository (10 mg total) rectally daily as needed for moderate constipation. 11/28/19   Almon Hercules, MD  docusate sodium (COLACE) 100 MG capsule Take 1 capsule (100 mg total) by mouth 2 (two) times daily. 11/28/19   Almon Hercules, MD  enoxaparin (LOVENOX) 40 MG/0.4ML injection Inject 0.4 mLs (40 mg total) into the skin daily for 14 doses. 11/25/19 12/09/19  [name], PA-C  feeding supplement, ENSURE ENLIVE, (ENSURE ENLIVE) LIQD Take 237 mLs by mouth 2 (two) times daily between meals. 11/28/19   Almon Hercules, MD  Multiple  Vitamins-Minerals (CERTA PLUS SENIOR PO) Take 1 tablet by mouth daily.    [provider]  [facility] powder Apply 1 application topically in the morning and at bedtime. 10/22/19   [provider]  ondansetron (ZOFRAN) 4 MG tablet Take 1 tablet (4 mg total) by mouth every 6 (six) hours as needed for nausea. 11/28/19   Almon Hercules, MD  oxyCODONE (OXY IR/ROXICODONE) 5 MG immediate release tablet Take 1 tablet (5 mg total) by mouth every 4 (four) hours as needed for severe pain. 11/25/19   [name], PA-C    Allergies Patient has no known allergies.  Family History  Problem Relation Age of Onset   Heart attack Father    Heart attack Brother     Social History Social History   Tobacco Use   Smoking status: Never   Smokeless tobacco: Never  Vaping Use   Vaping Use: Never used  Substance Use Topics   Alcohol use: Never   Drug use: Never    Review of Systems  Review of Systems   ____________________________________________  PHYSICAL EXAM:      VITAL SIGNS: ED Triage Vitals [04/21/21 2141]  Enc Vitals Group     BP 118/65     Pulse Rate (!) 123     Resp 20     Temp 98 F (36.7 C)     Temp Source Oral     SpO2 95 %  Weight 150 lb (68 kg)     Height 5\' 8"  (1.727 m)     Head Circumference      Peak Flow      Pain Score 0     Pain Loc      Pain Edu?      Excl. in GC?      Physical Exam    ____________________________________________   LABS (all labs ordered are listed, but only abnormal results are displayed)  Labs Reviewed  CBC - Abnormal; Notable for the following components:      Result Value   WBC 11.4 (*)    Hemoglobin 11.8 (*)    All other components within normal limits  LIPASE, BLOOD  COMPREHENSIVE METABOLIC PANEL  URINALYSIS, ROUTINE W REFLEX MICROSCOPIC  MAGNESIUM  PHOSPHORUS  TROPONIN I (HIGH SENSITIVITY)    ____________________________________________  EKG: *** ________________________________________  RADIOLOGY All  imaging, including plain films, CT scans, and ultrasounds, independently reviewed by me, and interpretations confirmed via formal radiology reads.  ED MD interpretation:   ***  Official radiology report(s): DG Chest 1 View  Result Date: 04/21/2021 CLINICAL DATA:  Nausea and vomiting and bradycardia. EXAM: CHEST  1 VIEW COMPARISON:  Chest radiograph dated 11/24/2019. FINDINGS: There is diffuse chronic interstitial coarsening. No focal consolidation, pleural effusion, pneumothorax. The cardiac silhouette is within limits. Atherosclerotic calcification of the aorta. Osteopenia with degenerative changes of the spine and shoulders. No acute osseous pathology. IMPRESSION: No active cardiopulmonary disease. Electronically Signed   By: 01/24/2020 M.D.   On: 04/21/2021 22:25    ____________________________________________  PROCEDURES   Procedure(s) performed (including Critical Care):  Procedures  ____________________________________________  INITIAL IMPRESSION / MDM / ASSESSMENT AND PLAN / ED COURSE  As part of my medical decision making, I reviewed the following data within the electronic MEDICAL RECORD NUMBER Nursing notes reviewed and incorporated, Old chart reviewed, Notes from prior ED visits, and [place] Controlled Substance Database       *Jane Bailey was evaluated in Emergency Department on 04/21/2021 for the symptoms described in the history of present illness. She was evaluated in the context of the global COVID-19 pandemic, which necessitated consideration that the patient might be at risk for infection with the SARS-CoV-2 virus that causes COVID-19. Institutional protocols and algorithms that pertain to the evaluation of patients at risk for COVID-19 are in a state of rapid change based on information released by regulatory bodies including the CDC and federal and state organizations. These policies and algorithms were followed during the patient's care in the ED.  Some ED  evaluations and interventions may be delayed as a result of limited staffing during the pandemic.*     Medical Decision Making:  ***  ____________________________________________  FINAL CLINICAL IMPRESSION(S) / ED DIAGNOSES  Final diagnoses:  Bradycardia     MEDICATIONS GIVEN DURING THIS VISIT:  Medications - No data to display   ED Discharge Orders     None        Note:  This document was prepared using Dragon voice recognition software and may include unintentional dictation errors.

## 2021-04-21 NOTE — ED Provider Notes (Addendum)
[facility] Emergency Department Provider Note  ____________________________________________   Event Date/Time   First MD Initiated Contact with Patient 04/21/21 2231     (approximate)  I have reviewed the triage vital signs and the nursing notes.   HISTORY  Chief Complaint No chief complaint on file.    HPI Jane Bailey is a 87 y.o. female here with generalized weakness and reported nausea and vomiting.  History provided primarily by EMS as well as the patient's daughter via telephone.  Patient reportedly has had nausea and vomiting with decreased appetite for the last several days.  Earlier today, she began hallucinating and was very distraught.  She now denies any complaints.  She was also noted to be bradycardic, though this is happened in the past.  On my assessment, denies any chest pain or lightheadedness.  Remainder of history limited due to severe dementia.    Past Medical History:  Diagnosis Date   Dementia (HCC) 11/23/2019   Per daughter   Fall 11/23/2019    Patient Active Problem List   Diagnosis Date Noted   Symptomatic bradycardia 04/21/2021   Fall 11/23/2019   Dementia (HCC) 11/23/2019   Closed displaced fracture of right femoral neck (HCC) 11/23/2019   Bradycardia 11/23/2019    Past Surgical History:  Procedure Laterality Date   ABDOMINAL HYSTERECTOMY     CHOLECYSTECTOMY     HIP ARTHROPLASTY Right 11/24/2019   Procedure: ARTHROPLASTY BIPOLAR HIP (HEMIARTHROPLASTY);  Surgeon: Signa Kell, MD;  Location: [facility] ORS;  Service: Orthopedics;  Laterality: Right;    Prior to Admission medications   Medication Sig Start Date End Date Taking? Authorizing Provider  acetaminophen (TYLENOL) 500 MG tablet Take 2 tablets (1,000 mg total) by mouth every 8 (eight) hours. 11/28/19   Almon Hercules, MD  bethanechol (URECHOLINE) 10 MG tablet Take 1 tablet (10 mg total) by mouth 3 (three) times daily. 11/28/19   Almon Hercules, MD  bisacodyl  (DULCOLAX) 10 MG suppository Place 1 suppository (10 mg total) rectally daily as needed for moderate constipation. 11/28/19   Almon Hercules, MD  docusate sodium (COLACE) 100 MG capsule Take 1 capsule (100 mg total) by mouth 2 (two) times daily. 11/28/19   Almon Hercules, MD  enoxaparin (LOVENOX) 40 MG/0.4ML injection Inject 0.4 mLs (40 mg total) into the skin daily for 14 doses. 11/25/19 12/09/19  [name], PA-C  feeding supplement, ENSURE ENLIVE, (ENSURE ENLIVE) LIQD Take 237 mLs by mouth 2 (two) times daily between meals. 11/28/19   Almon Hercules, MD  Multiple Vitamins-Minerals (CERTA PLUS SENIOR PO) Take 1 tablet by mouth daily.    [provider]  [facility] powder Apply 1 application topically in the morning and at bedtime. 10/22/19   [provider]  ondansetron (ZOFRAN) 4 MG tablet Take 1 tablet (4 mg total) by mouth every 6 (six) hours as needed for nausea. 11/28/19   Almon Hercules, MD  oxyCODONE (OXY IR/ROXICODONE) 5 MG immediate release tablet Take 1 tablet (5 mg total) by mouth every 4 (four) hours as needed for severe pain. 11/25/19   [name], PA-C    Allergies Patient has no known allergies.  Family History  Problem Relation Age of Onset   Heart attack Father    Heart attack Brother     Social History Social History   Tobacco Use   Smoking status: Never   Smokeless tobacco: Never  Vaping Use   Vaping Use: Never used  Substance Use  Topics   Alcohol use: Never   Drug use: Never    Review of Systems  Review of Systems  Unable to perform ROS: Dementia    ____________________________________________  PHYSICAL EXAM:      VITAL SIGNS: ED Triage Vitals [04/21/21 2141]  Enc Vitals Group     BP 118/65     Pulse Rate (!) 123     Resp 20     Temp 98 F (36.7 C)     Temp Source Oral     SpO2 95 %     Weight 150 lb (68 kg)     Height 5\' 8"  (1.727 m)     Head Circumference      Peak Flow      Pain Score 0     Pain Loc      Pain Edu?      Excl. in GC?       Physical Exam Vitals and nursing note reviewed.  Constitutional:      General: She is not in acute distress.    Appearance: She is well-developed.  HENT:     Head: Normocephalic and atraumatic.  Eyes:     Conjunctiva/sclera: Conjunctivae normal.  Cardiovascular:     Rate and Rhythm: Regular rhythm. Bradycardia present.     Heart sounds: Normal heart sounds.  Pulmonary:     Effort: Pulmonary effort is normal. No respiratory distress.     Breath sounds: No wheezing.  Abdominal:     General: There is no distension.  Musculoskeletal:     Cervical back: Neck supple.  Skin:    General: Skin is warm.     Capillary Refill: Capillary refill takes less than 2 seconds.     Findings: No rash.  Neurological:     Mental Status: She is alert and oriented to person, place, and time.     Motor: No abnormal muscle tone.      ____________________________________________   LABS (all labs ordered are listed, but only abnormal results are displayed)  Labs Reviewed  COMPREHENSIVE METABOLIC PANEL - Abnormal; Notable for the following components:      Result Value   Potassium 3.3 (*)    CO2 21 (*)    Glucose, Bld 139 (*)    Calcium 8.4 (*)    Albumin 3.3 (*)    All other components within normal limits  CBC - Abnormal; Notable for the following components:   WBC 11.4 (*)    Hemoglobin 11.8 (*)    All other components within normal limits  RESP PANEL BY RT-PCR (FLU A&B, COVID) ARPGX2  LIPASE, BLOOD  URINALYSIS, ROUTINE W REFLEX MICROSCOPIC  MAGNESIUM  PHOSPHORUS  BASIC METABOLIC PANEL  CBC  TROPONIN I (HIGH SENSITIVITY)  TROPONIN I (HIGH SENSITIVITY)    ____________________________________________  EKG: Complete heart block noted with ventricular escape rate of 37.  QTc 580.  QRS 96.  When compared to prior, third-degree heart block appears new. ________________________________________  RADIOLOGY All imaging, including plain films, CT scans, and ultrasounds,  independently reviewed by me, and interpretations confirmed via formal radiology reads.  ED MD interpretation:   Chest x-ray: No active disease  Official radiology report(s): DG Chest 1 View  Result Date: 04/21/2021 CLINICAL DATA:  Nausea and vomiting and bradycardia. EXAM: CHEST  1 VIEW COMPARISON:  Chest radiograph dated 11/24/2019. FINDINGS: There is diffuse chronic interstitial coarsening. No focal consolidation, pleural effusion, pneumothorax. The cardiac silhouette is within limits. Atherosclerotic calcification of the aorta. Osteopenia with degenerative changes  of the spine and shoulders. No acute osseous pathology. IMPRESSION: No active cardiopulmonary disease. Electronically Signed   By: Elgie Collard M.D.   On: 04/21/2021 22:25    ____________________________________________  PROCEDURES   Procedure(s) performed (including Critical Care):  Procedures  ____________________________________________  INITIAL IMPRESSION / MDM / ASSESSMENT AND PLAN / ED COURSE  As part of my medical decision making, I reviewed the following data within the electronic MEDICAL RECORD NUMBER Nursing notes reviewed and incorporated, Old chart reviewed, Notes from prior ED visits, and [place] Controlled Substance Database       *NERI VIEYRA was evaluated in Emergency Department on 04/21/2021 for the symptoms described in the history of present illness. She was evaluated in the context of the global COVID-19 pandemic, which necessitated consideration that the patient might be at risk for infection with the SARS-CoV-2 virus that causes COVID-19. Institutional protocols and algorithms that pertain to the evaluation of patients at risk for COVID-19 are in a state of rapid change based on information released by regulatory bodies including the CDC and federal and state organizations. These policies and algorithms were followed during the patient's care in the ED.  Some ED evaluations and interventions may be  delayed as a result of limited staffing during the pandemic.*     Medical Decision Making: 87 year old female here with generalized weakness and reported altered mental status and bradycardia.  On arrival, patient appears to be in third-degree heart block.  This appears new from prior although significant bradycardia was noted during her 2021 admission per review of hospital records.  She reportedly had some nausea and vomiting and lab work does show hypokalemia.  Replacement has been ordered.  Discussed case with Dr. Gwen Pounds of cardiology who will see the patient.  I discussed the case with her daughter as well, who states patient would be amenable to pacemaker placement if indicated.  Given her clinical stability, temp pacemaker not needed at this time but cardiology will discuss permanent placement in the morning.  Will plan to admit for telemetry and electrolyte replacement.  ____________________________________________  FINAL CLINICAL IMPRESSION(S) / ED DIAGNOSES  Final diagnoses:  Hypokalemia  Third degree AV block (HCC)     MEDICATIONS GIVEN DURING THIS VISIT:  Medications  potassium chloride SA (KLOR-CON M) CR tablet 40 mEq (has no administration in time range)  potassium chloride 10 mEq in 100 mL IVPB (has no administration in time range)  bisacodyl (DULCOLAX) suppository 10 mg (has no administration in time range)  feeding supplement (ENSURE ENLIVE / ENSURE PLUS) liquid 237 mL (has no administration in time range)  acetaminophen (TYLENOL) tablet 1,000 mg (has no administration in time range)    Or  acetaminophen (TYLENOL) suppository 650 mg (has no administration in time range)  ondansetron (ZOFRAN) tablet 4 mg (has no administration in time range)    Or  ondansetron (ZOFRAN) injection 4 mg (has no administration in time range)  enoxaparin (LOVENOX) injection 40 mg (has no administration in time range)     ED Discharge Orders     None        Note:  This document  was prepared using Dragon voice recognition software and may include unintentional dictation errors.   Shaune Pollack, MD 04/21/21 2320    Shaune Pollack, MD 04/21/21 [phone]

## 2021-04-21 NOTE — ED Triage Notes (Signed)
Pt brought in via ems from [facility].  Pt has n/v, low heart rate.  No chest pain or sob.  Pt alert.  Iv in place.  Hx demenita

## 2021-04-22 ENCOUNTER — Encounter
Admission: EM | Disposition: A | Discharge status: Skilled Nursing Facility | Payer: Self-pay | Source: Skilled Nursing Facility | Attending: Family Medicine

## 2021-04-22 ENCOUNTER — Encounter: Payer: Self-pay | Admitting: Family Medicine

## 2021-04-22 DIAGNOSIS — I442 Atrioventricular block, complete: Secondary | ICD-10-CM | POA: Diagnosis present

## 2021-04-22 DIAGNOSIS — Z888 Allergy status to other drugs, medicaments and biological substances status: Secondary | ICD-10-CM | POA: Diagnosis not present

## 2021-04-22 DIAGNOSIS — F03C2 Unspecified dementia, severe, with psychotic disturbance: Secondary | ICD-10-CM | POA: Diagnosis present

## 2021-04-22 DIAGNOSIS — Z20822 Contact with and (suspected) exposure to covid-19: Secondary | ICD-10-CM | POA: Diagnosis present

## 2021-04-22 DIAGNOSIS — I248 Other forms of acute ischemic heart disease: Secondary | ICD-10-CM | POA: Diagnosis present

## 2021-04-22 DIAGNOSIS — R64 Cachexia: Secondary | ICD-10-CM | POA: Diagnosis present

## 2021-04-22 DIAGNOSIS — Z8249 Family history of ischemic heart disease and other diseases of the circulatory system: Secondary | ICD-10-CM | POA: Diagnosis not present

## 2021-04-22 DIAGNOSIS — N390 Urinary tract infection, site not specified: Secondary | ICD-10-CM | POA: Diagnosis present

## 2021-04-22 DIAGNOSIS — E876 Hypokalemia: Secondary | ICD-10-CM | POA: Diagnosis present

## 2021-04-22 DIAGNOSIS — Z79899 Other long term (current) drug therapy: Secondary | ICD-10-CM | POA: Diagnosis not present

## 2021-04-22 DIAGNOSIS — R001 Bradycardia, unspecified: Secondary | ICD-10-CM | POA: Diagnosis present

## 2021-04-22 DIAGNOSIS — Z006 Encounter for examination for normal comparison and control in clinical research program: Secondary | ICD-10-CM | POA: Diagnosis not present

## 2021-04-22 DIAGNOSIS — F03C0 Unspecified dementia, severe, without behavioral disturbance, psychotic disturbance, mood disturbance, and anxiety: Secondary | ICD-10-CM

## 2021-04-22 DIAGNOSIS — Z6822 Body mass index (BMI) 22.0-22.9, adult: Secondary | ICD-10-CM | POA: Diagnosis not present

## 2021-04-22 DIAGNOSIS — G9341 Metabolic encephalopathy: Secondary | ICD-10-CM | POA: Diagnosis present

## 2021-04-22 HISTORY — PX: PACEMAKER LEADLESS INSERTION: EP1219

## 2021-04-22 LAB — CBC
HCT: 35.6 % — ABNORMAL LOW (ref 36.0–46.0)
Hemoglobin: 11.6 g/dL — ABNORMAL LOW (ref 12.0–15.0)
MCH: 29.8 pg (ref 26.0–34.0)
MCHC: 32.6 g/dL (ref 30.0–36.0)
MCV: 91.5 fL (ref 80.0–100.0)
Platelets: 192 10*3/uL (ref 150–400)
RBC: 3.89 MIL/uL (ref 3.87–5.11)
RDW: 14.3 % (ref 11.5–15.5)
WBC: 7.5 10*3/uL (ref 4.0–10.5)
nRBC: 0 % (ref 0.0–0.2)

## 2021-04-22 LAB — URINALYSIS, ROUTINE W REFLEX MICROSCOPIC
Bilirubin Urine: NEGATIVE
Glucose, UA: NEGATIVE mg/dL
Hgb urine dipstick: NEGATIVE
Ketones, ur: 5 mg/dL — AB
Leukocytes,Ua: NEGATIVE
Nitrite: POSITIVE — AB
Protein, ur: 100 mg/dL — AB
Specific Gravity, Urine: 1.029 (ref 1.005–1.030)
pH: 5 (ref 5.0–8.0)

## 2021-04-22 LAB — BASIC METABOLIC PANEL
Anion gap: 7 (ref 5–15)
BUN: 16 mg/dL (ref 8–23)
CO2: 24 mmol/L (ref 22–32)
Calcium: 8.5 mg/dL — ABNORMAL LOW (ref 8.9–10.3)
Chloride: 107 mmol/L (ref 98–111)
Creatinine, Ser: 0.69 mg/dL (ref 0.44–1.00)
GFR, Estimated: >60 mL/min (ref >60)
Glucose, Bld: 144 mg/dL — ABNORMAL HIGH (ref 70–99)
Potassium: 3.6 mmol/L (ref 3.5–5.1)
Sodium: 138 mmol/L (ref 135–145)

## 2021-04-22 LAB — MAGNESIUM: Magnesium: 2 mg/dL (ref 1.7–2.4)

## 2021-04-22 LAB — TROPONIN I (HIGH SENSITIVITY)
Troponin I (High Sensitivity): 2055 ng/L (ref <18)
Troponin I (High Sensitivity): 250 ng/L (ref <18)

## 2021-04-22 LAB — RESP PANEL BY RT-PCR (FLU A&B, COVID) ARPGX2
Influenza A by PCR: NEGATIVE
Influenza B by PCR: NEGATIVE
SARS Coronavirus 2 by RT PCR: NEGATIVE

## 2021-04-22 LAB — PHOSPHORUS: Phosphorus: 4.3 mg/dL (ref 2.5–4.6)

## 2021-04-22 LAB — PROTIME-INR
INR: 1.2 (ref 0.8–1.2)
Prothrombin Time: 15 seconds (ref 11.4–15.2)

## 2021-04-22 LAB — TSH: TSH: 2.56 u[IU]/mL (ref 0.350–4.500)

## 2021-04-22 LAB — APTT: aPTT: 33 seconds (ref 24–36)

## 2021-04-22 SURGERY — PACEMAKER LEADLESS INSERTION
Anesthesia: Moderate Sedation

## 2021-04-22 MED ORDER — HEPARIN BOLUS VIA INFUSION
4000.0000 [IU] | Freq: Once | INTRAVENOUS | Status: AC
  Administered 2021-04-22: 02:00:00 4000 [IU] via INTRAVENOUS
  Filled 2021-04-22: qty 4000

## 2021-04-22 MED ORDER — ACETAMINOPHEN 325 MG PO TABS
650.0000 mg | ORAL_TABLET | ORAL | Status: DC | PRN
  Administered 2021-04-22: 22:00:00 650 mg via ORAL
  Filled 2021-04-22: qty 2

## 2021-04-22 MED ORDER — CEFAZOLIN SODIUM-DEXTROSE 2-4 GM/100ML-% IV SOLN: 2.0000 g | INTRAVENOUS | Status: AC

## 2021-04-22 MED ORDER — MIDAZOLAM HCL 2 MG/2ML IJ SOLN
INTRAMUSCULAR | Status: DC | PRN
  Administered 2021-04-22: .5 mg via INTRAVENOUS

## 2021-04-22 MED ORDER — ONDANSETRON HCL 4 MG/2ML IJ SOLN: 4.0000 mg | Freq: Four times a day (QID) | INTRAMUSCULAR | Status: DC | PRN

## 2021-04-22 MED ORDER — HEPARIN (PORCINE) 25000 UT/250ML-% IV SOLN
850.0000 [IU]/h | INTRAVENOUS | Status: DC
  Administered 2021-04-22: 02:00:00 850 [IU]/h via INTRAVENOUS
  Filled 2021-04-22: qty 250

## 2021-04-22 MED ORDER — FENTANYL CITRATE (PF) 100 MCG/2ML IJ SOLN
INTRAMUSCULAR | Status: DC | PRN
  Administered 2021-04-22: 25 ug via INTRAVENOUS

## 2021-04-22 MED ORDER — HEPARIN SODIUM (PORCINE) 1000 UNIT/ML IJ SOLN
INTRAMUSCULAR | Status: AC
  Filled 2021-04-22: qty 10

## 2021-04-22 MED ORDER — SODIUM CHLORIDE 0.9% FLUSH: 3.0000 mL | INTRAVENOUS | Status: DC | PRN

## 2021-04-22 MED ORDER — IOHEXOL 300 MG/ML  SOLN
INTRAMUSCULAR | Status: DC | PRN
  Administered 2021-04-22: 10:00:00 15 mL

## 2021-04-22 MED ORDER — SODIUM CHLORIDE 0.9 % IV SOLN: INTRAVENOUS | Status: DC

## 2021-04-22 MED ORDER — SODIUM CHLORIDE 0.9% FLUSH
3.0000 mL | Freq: Two times a day (BID) | INTRAVENOUS | Status: DC
  Administered 2021-04-22: 22:00:00 3 mL via INTRAVENOUS

## 2021-04-22 MED ORDER — SODIUM CHLORIDE 0.45 % IV SOLN: INTRAVENOUS | Status: DC

## 2021-04-22 MED ORDER — HEPARIN SODIUM (PORCINE) 1000 UNIT/ML IJ SOLN
INTRAMUSCULAR | Status: DC | PRN
  Administered 2021-04-22: 3500 [IU] via INTRAVENOUS

## 2021-04-22 MED ORDER — SODIUM CHLORIDE 0.9 % IV SOLN
1.0000 g | INTRAVENOUS | Status: DC
  Administered 2021-04-22: 14:00:00 1 g via INTRAVENOUS
  Filled 2021-04-22 (×3): qty 10

## 2021-04-22 MED ORDER — LIDOCAINE HCL (PF) 1 % IJ SOLN
INTRAMUSCULAR | Status: DC | PRN
  Administered 2021-04-22: 30 mL

## 2021-04-22 MED ORDER — LIDOCAINE HCL 1 % IJ SOLN
INTRAMUSCULAR | Status: AC
  Filled 2021-04-22: qty 20

## 2021-04-22 MED ORDER — MIDAZOLAM HCL 2 MG/2ML IJ SOLN
INTRAMUSCULAR | Status: AC
  Filled 2021-04-22: qty 2

## 2021-04-22 MED ORDER — SODIUM CHLORIDE 0.9 % IV SOLN: 250.0000 mL | INTRAVENOUS | Status: DC | PRN

## 2021-04-22 MED ORDER — FENTANYL CITRATE (PF) 100 MCG/2ML IJ SOLN
INTRAMUSCULAR | Status: AC
  Filled 2021-04-22: qty 2

## 2021-04-22 MED ORDER — HEPARIN (PORCINE) IN NACL 1000-0.9 UT/500ML-% IV SOLN
INTRAVENOUS | Status: DC | PRN
  Administered 2021-04-22 (×2): 500 mL

## 2021-04-22 SURGICAL SUPPLY — 16 items
DILATOR VESSEL 38 20CM 12FR (INTRODUCER) ×2 IMPLANT
DILATOR VESSEL 38 20CM 14FR (INTRODUCER) ×2 IMPLANT
DILATOR VESSEL 38 20CM 18FR (INTRODUCER) ×2 IMPLANT
DILATOR VESSEL 38 20CM 8FR (INTRODUCER) ×2 IMPLANT
MICRA AV TRANSCATH PACING SYS (Pacemaker) ×3 IMPLANT
MICRA INTRODUCER SHEATH (SHEATH) ×3
NDL PERC 18GX7CM (NEEDLE) IMPLANT
NEEDLE PERC 18GX7CM (NEEDLE) ×3 IMPLANT
PACK CARDIAC CATH (CUSTOM PROCEDURE TRAY) ×3 IMPLANT
PAD ELECT DEFIB RADIOL ZOLL (MISCELLANEOUS) ×2 IMPLANT
PROTECTION STATION PRESSURIZED (MISCELLANEOUS) ×3
SHEATH AVANTI 7FRX11 (SHEATH) ×2 IMPLANT
SHEATH INTRODUCER MICRA (SHEATH) IMPLANT
STATION PROTECTION PRESSURIZED (MISCELLANEOUS) IMPLANT
SYSTEM PACING TRNSCTH AV MICRA (Pacemaker) IMPLANT
WIRE AMPLATZ SS-J .035X180CM (WIRE) ×2 IMPLANT

## 2021-04-22 NOTE — Progress Notes (Signed)
Pt. Hallucinating, stating "take those monsters away-they are just standing there 'agrinning' at me." Assured pt. She is safe. Placed purewick catheter on pt. Labia & noted white, creamy drainage coming out of vaginal area." Paged MD to make aware.

## 2021-04-22 NOTE — Plan of Care (Signed)

## 2021-04-22 NOTE — Consult Note (Signed)
[facility] Cardiology Consultation Note  Patient ID: Jane Bailey, MRN: [id number], DOB/AGE: 06/13/1930 87 y.o. Admit date: 04/21/2021   Date of Consult: 04/22/2021 Primary Physician: Drue Flirt, MD Primary Cardiologist: None  Chief Complaint: No chief complaint on file.  Reason for Consult:  Complete heart block  HPI: 87 y.o. female with a significant bradycardia weakness and complete heart block.  The patient has had some dementia and is unable to give a significant history but is conversant and some manners.  The patient is in a nursing home for which she she apparently ambulates well with no evidence of significant major cardiovascular concerns.  Recently she has had some bradycardia weakness and abdominal and vomiting which may be consistent with concerns for bradycardia.  She was seen in the emergency room with an EKG showing sinus bradycardia with complete heart block and junctional escape of 30 to 35 bpm.  The patient is hemodynamically stable with no evidence of other significant concerns.  Chest x-ray was normal and troponin was 150/250 consistent with demand ischemia.  There is no evidence of congestive heart failure for other cardiovascular concerns.  The family has expressed wishes to continue to treat this complete heart block potentially with a pacemaker Micra  Past Medical History:  Diagnosis Date   Dementia (HCC) 11/23/2019   Per daughter   Fall 11/23/2019      Surgical History:  Past Surgical History:  Procedure Laterality Date   ABDOMINAL HYSTERECTOMY     CHOLECYSTECTOMY     HIP ARTHROPLASTY Right 11/24/2019   Procedure: ARTHROPLASTY BIPOLAR HIP (HEMIARTHROPLASTY);  Surgeon: Signa Kell, MD;  Location: [facility] ORS;  Service: Orthopedics;  Laterality: Right;     Home Meds: Prior to Admission medications   Medication Sig Start Date End Date Taking? Authorizing Provider  acetaminophen (TYLENOL) 500 MG tablet Take 2 tablets (1,000 mg total) by mouth every  8 (eight) hours. 11/28/19  Yes Almon Hercules, MD  bethanechol (URECHOLINE) 10 MG tablet Take 1 tablet (10 mg total) by mouth 3 (three) times daily. 11/28/19  Yes Almon Hercules, MD  docusate sodium (COLACE) 100 MG capsule Take 1 capsule (100 mg total) by mouth 2 (two) times daily. 11/28/19  Yes Almon Hercules, MD  Multiple Vitamins-Minerals (CERTA PLUS SENIOR PO) Take 1 tablet by mouth daily.   Yes [provider]  Skin Protectants, Misc. (EUCERIN) cream Apply topically as needed for dry skin. Apply topically once daily as needed, especially to legs.   Yes [provider]  bisacodyl (DULCOLAX) 10 MG suppository Place 1 suppository (10 mg total) rectally daily as needed for moderate constipation. Patient not taking: Reported on 04/21/2021 11/28/19   Almon Hercules, MD  enoxaparin (LOVENOX) 40 MG/0.4ML injection Inject 0.4 mLs (40 mg total) into the skin daily for 14 doses. 11/25/19 12/09/19  [name], PA-C  feeding supplement, ENSURE ENLIVE, (ENSURE ENLIVE) LIQD Take 237 mLs by mouth 2 (two) times daily between meals. 11/28/19   Almon Hercules, MD  [facility] powder Apply 1 application topically in the morning and at bedtime. Patient not taking: Reported on 04/21/2021 10/22/19   [provider]  ondansetron (ZOFRAN) 4 MG tablet Take 1 tablet (4 mg total) by mouth every 6 (six) hours as needed for nausea. Patient not taking: Reported on 04/21/2021 11/28/19   Almon Hercules, MD  oxyCODONE (OXY IR/ROXICODONE) 5 MG immediate release tablet Take 1 tablet (5 mg total) by mouth every 4 (four) hours as needed for severe  pain. 11/25/19   [name], PA-C    Inpatient Medications:   feeding supplement  237 mL Oral BID BM    heparin 850 Units/hr (04/22/21 0227)    Allergies:  Allergies  Allergen Reactions   Fosamax [Alendronate]     Social History   Socioeconomic History   Marital status: Unknown    Spouse name: Not on file   Number of children: Not on file   Years of education: Not on file    Highest education level: Not on file  Occupational History   Not on file  Tobacco Use   Smoking status: Never   Smokeless tobacco: Never  Vaping Use   Vaping Use: Never used  Substance and Sexual Activity   Alcohol use: Never   Drug use: Never   Sexual activity: Not Currently  Other Topics Concern   Not on file  Social History Narrative   Not on file   Social Determinants of Health   Financial Resource Strain: Not on file  Food Insecurity: Not on file  Transportation Needs: Not on file  Physical Activity: Not on file  Stress: Not on file  Social Connections: Not on file  Intimate Partner Violence: Not on file     Family History  Problem Relation Age of Onset   Heart attack Father    Heart attack Brother      Review of Systems Cannot assess  Labs: No results for input(s): CKTOTAL, CKMB, TROPONINI in the last 72 hours. Lab Results  Component Value Date   WBC 11.4 (H) 04/21/2021   HGB 11.8 (L) 04/21/2021   HCT 36.3 04/21/2021   MCV 91.9 04/21/2021   PLT 204 04/21/2021    Recent Labs  Lab 04/21/21 2147  NA 135  K 3.3*  CL 109  CO2 21*  BUN 18  CREATININE 0.67  CALCIUM 8.4*  PROT 7.4  BILITOT 1.0  ALKPHOS 76  ALT 23  AST 25  GLUCOSE 139*   No results found for: CHOL, HDL, LDLCALC, TRIG No results found for: DDIMER  Radiology/Studies:  DG Chest 1 View  Result Date: 04/21/2021 CLINICAL DATA:  Nausea and vomiting and bradycardia. EXAM: CHEST  1 VIEW COMPARISON:  Chest radiograph dated 11/24/2019. FINDINGS: There is diffuse chronic interstitial coarsening. No focal consolidation, pleural effusion, pneumothorax. The cardiac silhouette is within limits. Atherosclerotic calcification of the aorta. Osteopenia with degenerative changes of the spine and shoulders. No acute osseous pathology. IMPRESSION: No active cardiopulmonary disease. Electronically Signed   By: Elgie Collard M.D.   On: 04/21/2021 22:25    EKG: Sinus bradycardia with complete heart  block and junctional escape  Weights: Filed Weights   04/21/21 2141  Weight: 68 kg     Physical Exam: Blood pressure (!) 135/42, pulse (!) 33, temperature 98 F (36.7 C), temperature source Oral, resp. rate 17, height 5\' 8"  (1.727 m), weight 68 kg, SpO2 97 %. Body mass index is 22.81 kg/m. General: Well developed, well nourished, in no acute distress. Head eyes ears nose throat: Normocephalic, atraumatic, sclera non-icteric, no xanthomas, nares are without discharge. No apparent thyromegaly and/or mass  Lungs: Normal respiratory effort.  no wheezes, no rales, no rhonchi.  Heart: RRR with normal S1 S2. no murmur gallop, no rub, PMI is normal size and placement, carotid upstroke normal without bruit, jugular venous pressure is normal Abdomen: Soft, non-tender, non-distended with normoactive bowel sounds. No hepatomegaly. No rebound/guarding. No obvious abdominal masses. Abdominal aorta is normal size without bruit Extremities: No  edema. no cyanosis, no clubbing, no ulcers  Peripheral : 2+ bilateral upper extremity pulses, 2+ bilateral femoral pulses, 2+ bilateral dorsal pedal pulse Neuro: Alert and not oriented. No facial asymmetry. No focal deficit. Moves all extremities spontaneously. Musculoskeletal: Normal muscle tone without kyphosis Psych: Does not responds to questions appropriately with a normal affect.    Assessment: 87 year old female with no evidence of previous cardiovascular history having weakness fatigue nausea and new onset episode of complete heart block with junctional escape at 35 bpm needing further intervention and no evidence of current acute coronary syndrome and/or congestive heart failure.  Patient's family wishes to pursue further treatment  Plan: 1.  Continue supportive care for blood pressure and heart rate with further discussion with the family for possible Micra pacemaker placement due to complete heart block weakness fatigue nausea.  Family will understand  the risks and benefits of Micra pacemaker placement including bleeding bruising infection hemopericardium and side effects of medication management.  Patient is at low risk for conscious sedation  Signed, Lamar Blinks M.D. [facility] Cardiology 04/22/2021, 7:22 AM

## 2021-04-22 NOTE — Progress Notes (Signed)
Drs. Darrold Junker and Gwen Pounds both saw pt. And her [name] at bedside & reviewed leadless pacemaker. Son verbalized understanding of conversation. Pt. Nods understanding. Pt. Remains confused (dementia): aware that she is in hospital and herself. Pt. Able to state "it's December", yet unaware of year. Speech clear.

## 2021-04-22 NOTE — Progress Notes (Signed)
Pt. Still hallucinating, stating "I still see animals: a bunch of large kitty cats with bulging eyes." Assured pt. She is safe. Speech clear. Right groin clean, dry, intact without any complications.

## 2021-04-22 NOTE — Progress Notes (Signed)
[employer] pacemaker Rep. At bedside interrogating pacer at bedside. Rate set at 60.

## 2021-04-22 NOTE — Progress Notes (Signed)
ANTICOAGULATION CONSULT NOTE  Pharmacy Consult for heparin infusion Indication: NSTEMI  Allergies  Allergen Reactions   Fosamax [Alendronate]     Patient Measurements: Height: 5\' 8"  (172.7 cm) Weight: 68 kg (150 lb) IBW/kg (Calculated) : 63.9 Heparin Dosing Weight: 68 kg  Vital Signs: Temp: 98 F (36.7 C) (12/28 2141) Temp Source: Oral (12/28 2141) BP: 123/49 (12/29 0200) Pulse Rate: 36 (12/29 0200)  Labs: Recent Labs    04/21/21 2147 04/21/21 2157 04/21/21 2335  HGB 11.8*  --   --   HCT 36.3  --   --   PLT 204  --   --   CREATININE 0.67  --   --   TROPONINIHS  --  150* 250*    Estimated Creatinine Clearance: 49 mL/min (by C-G formula based on SCr of 0.67 mg/dL).   Medical History: Past Medical History:  Diagnosis Date   Dementia (HCC) 11/23/2019   Per daughter   Fall 11/23/2019    Medications:  Lovenox Proph initially ordered, but D/C order prior to any doses being charted.  Assessment: Pt is 87 yo female presenting to ED c/o low heart rate, found with Troponin I mildly elevated, trending up.  Goal of Therapy:  Heparin level 0.3-0.7 units/ml Monitor platelets by anticoagulation protocol: Yes   Plan:  Bolus 4000 units x 1 Start heparin infusion at 850 units/hr Check HL in 8 hr after start of infusion CBC daily while on heparin  98, PharmD, [facility] 04/22/2021 2:24 AM

## 2021-04-22 NOTE — Progress Notes (Signed)
Dr. Caleb Popp by to see pt. :IVF increased to 75 ml/hr. Per new orders. MD aware pt. Has not voided yet.

## 2021-04-22 NOTE — Progress Notes (Signed)
PROGRESS NOTE    SECRET KRISTENSEN  FIE:[id number] DOB: 1930/06/22 DOA: 04/21/2021 PCP: Drue Flirt, MD   Brief Narrative: Jane Bailey is a 87 y.o. female with a history of bradycardia, dementia. Patient presented secondary to hallucinations and bradycardia and found to have symptomatic bradycardia. Cardiology consulted and recommendation for pacemaker placement, which was performed on 12/29. Patient also with hallucinations possibly related to UTI.   Assessment & Plan:   Principal Problem:   Symptomatic bradycardia Active Problems:   Dementia (HCC)   Bradycardia   Leukocytosis   Symptomatic bradycardia Patient with HR in the 30-40s. Reports of falls at facility. Cardiology consulted and patient is s/p Micra pacemaker placement. -Cardiology recommendations: monitor overnight, ambulate  Hallucinations Acute metabolic encephalopathy Not baseline. Possibly related to delirium from dementia and/or possible infection. Concern for purulent urinary drainage.  Possible UTI Abnormal urinalysis with confusion. Patient unable to verbalize symptoms. -Urine culture -Empiric Ceftriaxone IV  Dementia Patient is a resident at a facility. At baseline, patient is oriented to self only.  Elevated troponin Concern for NSTEMI. Difficult to assess since patient is a poor historian. Possible demand ischemia related to severe/symptomatic bradycardia.   DVT prophylaxis: SCDs Code Status:   Code Status: Full Code Family Communication: Daughter on telephone Disposition Plan: Discharge back to facility in 1-3 days pending cardiology recommendations and return of mental status to baseline   Consultants:  Cardiology  Procedures:  PPM placement  Antimicrobials: Ceftriaxone IV    Subjective: Reports of hallucinations earlier.  Objective: Vitals:   04/22/21 1020 04/22/21 1030 04/22/21 1045 04/22/21 1230  BP:  (!) 141/62 140/69 (!) 146/65  Pulse: (!) 49 (!) 48 61 (!) 120   Resp: 17 (!) 22 18 18   Temp:      TempSrc:      SpO2: 96% 94% 95% 98%  Weight:      Height:        Intake/Output Summary (Last 24 hours) at 04/22/2021 1400 Last data filed at 04/22/2021 0054 Gross per 24 hour  Intake --  Output 100 ml  Net -100 ml   Filed Weights   04/21/21 2141 04/22/21 0817  Weight: 68 kg 68 kg    Examination:  General exam: Appears calm and comfortable  Respiratory system: Clear to auscultation. Respiratory effort normal. Cardiovascular system: S1 & S2 heard, RRR. No murmurs, rubs, gallops or clicks. Gastrointestinal system: Abdomen is nondistended, soft and nontender. No organomegaly or masses felt. Normal bowel sounds heard. Central nervous system: Somnolent. Musculoskeletal: No edema. No calf tenderness Skin: No cyanosis. No rashes    Data Reviewed: I have personally reviewed following labs and imaging studies  CBC Lab Results  Component Value Date   WBC 11.4 (H) 04/21/2021   RBC 3.95 04/21/2021   HGB 11.8 (L) 04/21/2021   HCT 36.3 04/21/2021   MCV 91.9 04/21/2021   MCH 29.9 04/21/2021   PLT 204 04/21/2021   MCHC 32.5 04/21/2021   RDW 14.0 04/21/2021   LYMPHSABS 0.8 11/24/2019   MONOABS 1.0 11/24/2019   EOSABS 0.2 11/24/2019   BASOSABS 0.1 11/24/2019     Last metabolic panel Lab Results  Component Value Date   NA 135 04/21/2021   K 3.3 (L) 04/21/2021   CL 109 04/21/2021   CO2 21 (L) 04/21/2021   BUN 18 04/21/2021   CREATININE 0.67 04/21/2021   GLUCOSE 139 (H) 04/21/2021   GFRNONAA >60 04/21/2021   GFRAA >60 11/25/2019   CALCIUM 8.4 (L) 04/21/2021   PHOS  4.3 04/21/2021   PROT 7.4 04/21/2021   ALBUMIN 3.3 (L) 04/21/2021   BILITOT 1.0 04/21/2021   ALKPHOS 76 04/21/2021   AST 25 04/21/2021   ALT 23 04/21/2021   ANIONGAP 5 04/21/2021    CBG (last 3)  No results for input(s): GLUCAP in the last 72 hours.   GFR: Estimated Creatinine Clearance: 49 mL/min (by C-G formula based on SCr of 0.67 mg/dL).  Coagulation  Profile: No results for input(s): INR, PROTIME in the last 168 hours.  Recent Results (from the past 240 hour(s))  Resp Panel by RT-PCR (Flu A&B, Covid) Nasopharyngeal Swab     Status: None   Collection Time: 04/21/21 11:35 PM   Specimen: Nasopharyngeal Swab; Nasopharyngeal(NP) swabs in vial transport medium  Result Value Ref Range Status   SARS Coronavirus 2 by RT PCR NEGATIVE NEGATIVE Final    Comment: (NOTE) SARS-CoV-2 target nucleic acids are NOT DETECTED.  The SARS-CoV-2 RNA is generally detectable in upper respiratory specimens during the acute phase of infection. The lowest concentration of SARS-CoV-2 viral copies this assay can detect is 138 copies/mL. A negative result does not preclude SARS-Cov-2 infection and should not be used as the sole basis for treatment or other patient management decisions. A negative result may occur with  improper specimen collection/handling, submission of specimen other than nasopharyngeal swab, presence of viral mutation(s) within the areas targeted by this assay, and inadequate number of viral copies(<138 copies/mL). A negative result must be combined with clinical observations, patient history, and epidemiological information. The expected result is Negative.  Fact Sheet for Patients:  BloggerCourse.com  Fact Sheet for Healthcare Providers:  SeriousBroker.it  This test is no t yet approved or cleared by the [place] FDA and  has been authorized for detection and/or diagnosis of SARS-CoV-2 by FDA under an Emergency Use Authorization (EUA). This EUA will remain  in effect (meaning this test can be used) for the duration of the COVID-19 declaration under Section 564(b)(1) of the Act, 21 U.S.C.section 360bbb-3(b)(1), unless the authorization is terminated  or revoked sooner.       Influenza A by PCR NEGATIVE NEGATIVE Final   Influenza B by PCR NEGATIVE NEGATIVE Final    Comment:  (NOTE) The Xpert Xpress SARS-CoV-2/FLU/RSV plus assay is intended as an aid in the diagnosis of influenza from Nasopharyngeal swab specimens and should not be used as a sole basis for treatment. Nasal washings and aspirates are unacceptable for Xpert Xpress SARS-CoV-2/FLU/RSV testing.  Fact Sheet for Patients: BloggerCourse.com  Fact Sheet for Healthcare Providers: SeriousBroker.it  This test is not yet approved or cleared by the [place] FDA and has been authorized for detection and/or diagnosis of SARS-CoV-2 by FDA under an Emergency Use Authorization (EUA). This EUA will remain in effect (meaning this test can be used) for the duration of the COVID-19 declaration under Section 564(b)(1) of the Act, 21 U.S.C. section 360bbb-3(b)(1), unless the authorization is terminated or revoked.  Performed at [facility], [address], [place], [place] [address]         Radiology Studies: DG Chest 1 View  Result Date: 04/21/2021 CLINICAL DATA:  Nausea and vomiting and bradycardia. EXAM: CHEST  1 VIEW COMPARISON:  Chest radiograph dated 11/24/2019. FINDINGS: There is diffuse chronic interstitial coarsening. No focal consolidation, pleural effusion, pneumothorax. The cardiac silhouette is within limits. Atherosclerotic calcification of the aorta. Osteopenia with degenerative changes of the spine and shoulders. No acute osseous pathology. IMPRESSION: No active cardiopulmonary disease. Electronically Signed  By: Elgie Collard M.D.   On: 04/21/2021 22:25   EP PPM/ICD IMPLANT  Result Date: 04/22/2021 Successful Micra AV leadless pacemaker implantation        Scheduled Meds:  feeding supplement  237 mL Oral BID BM   sodium chloride flush  3 mL Intravenous Q12H   Continuous Infusions:  sodium chloride     sodium chloride     sodium chloride      ceFAZolin (ANCEF) IV     cefTRIAXone (ROCEPHIN)  IV     heparin  Stopped (04/22/21 0835)     LOS: 0 days     Jacquelin Hawking, MD [facility] 04/22/2021, 2:00 PM  If 7PM-7AM, please contact night-coverage [email or web address]

## 2021-04-22 NOTE — Progress Notes (Signed)
Pt. Still unable to urinate after approx. 8 hrs. Orders received for I & O catheter.Noted after cleaning pt. Vaginal area that green sutures were protruding from pt. Vaginal area. Called pt. Daughter & she states pt. Had 2 separate bladder tucks approx. 30 and 14 years ago. Pt. Daughter also states that pt. Frequently states "I feel like I have to go & I just can't." Pt. Started on Rocephin IVPB until urineC&S results from lab specimen received. Orders received for prn bladder scan & possible foley if pt. Still unable to urinate. Pt. States she still sees "people with spots and occ. Kitties.". Pt. Son returned to sit with pt. Now. Pt. In stable condition with no complications noted to right groin.

## 2021-04-22 NOTE — Progress Notes (Signed)
Spoke with Dr. Caleb Popp re: urine collected in ER 12 hours ago. Per MD request, called lab & added Culture/sensitivity to urine previously collected.

## 2021-04-22 NOTE — Progress Notes (Signed)
[facility] Cardiology [facility] Encounter Note  Patient: Jane Bailey / Admit Date: 04/21/2021 / Date of Encounter: 04/22/2021, 11:03 AM   Subjective: Patient had complete heart block with junctional escape rhythm at 30 bpm and now has received a Micra pacemaker placement without complication and baseline rate of 60 bpm.  The patient feels well at this time  Review of Systems: Positive for: None Negative for: Vision change, hearing change, syncope, dizziness, nausea, vomiting,diarrhea, bloody stool, stomach pain, cough, congestion, diaphoresis, urinary frequency, urinary pain,skin lesions, skin rashes Others previously listed  Objective: Telemetry: Ventricular pacing Physical Exam: Blood pressure 140/69, pulse 61, temperature 98.1 F (36.7 C), temperature source Oral, resp. rate 18, height 5\' 8"  (1.727 m), weight 68 kg, SpO2 95 %. Body mass index is 22.79 kg/m. General: Well developed, well nourished, in no acute distress. Head: Normocephalic, atraumatic, sclera non-icteric, no xanthomas, nares are without discharge. Neck: No apparent masses Lungs: Normal respirations with no wheezes, no rhonchi, no rales , no crackles   Heart: Regular rate and rhythm, normal S1 S2, no murmur, no rub, no gallop, PMI is normal size and placement, carotid upstroke normal without bruit, jugular venous pressure normal Abdomen: Soft, non-tender, non-distended with normoactive bowel sounds. No hepatosplenomegaly. Abdominal aorta is normal size without bruit Extremities: No edema, no clubbing, no cyanosis, no ulcers,  Peripheral: 2+ radial, 2+ femoral, 2+ dorsal pedal pulses Neuro: Alert and oriented. Moves all extremities spontaneously. Psych:  Responds to questions appropriately with a normal affect.   Intake/Output Summary (Last 24 hours) at 04/22/2021 1103 Last data filed at 04/22/2021 0054 Gross per 24 hour  Intake --  Output 100 ml  Net -100 ml    Inpatient Medications:   feeding  supplement  237 mL Oral BID BM   sodium chloride flush  3 mL Intravenous Q12H   Infusions:   sodium chloride     sodium chloride     sodium chloride      ceFAZolin (ANCEF) IV     heparin Stopped (04/22/21 0835)    Labs: Recent Labs    04/21/21 2147 04/21/21 2335  NA 135  --   K 3.3*  --   CL 109  --   CO2 21*  --   GLUCOSE 139*  --   BUN 18  --   CREATININE 0.67  --   CALCIUM 8.4*  --   MG  --  2.0  PHOS  --  4.3   Recent Labs    04/21/21 2147  AST 25  ALT 23  ALKPHOS 76  BILITOT 1.0  PROT 7.4  ALBUMIN 3.3*   Recent Labs    04/21/21 2147  WBC 11.4*  HGB 11.8*  HCT 36.3  MCV 91.9  PLT 204   No results for input(s): CKTOTAL, CKMB, TROPONINI in the last 72 hours. Invalid input(s): POCBNP No results for input(s): HGBA1C in the last 72 hours.   Weights: Filed Weights   04/21/21 2141 04/22/21 0817  Weight: 68 kg 68 kg     Radiology/Studies:  DG Chest 1 View  Result Date: 04/21/2021 CLINICAL DATA:  Nausea and vomiting and bradycardia. EXAM: CHEST  1 VIEW COMPARISON:  Chest radiograph dated 11/24/2019. FINDINGS: There is diffuse chronic interstitial coarsening. No focal consolidation, pleural effusion, pneumothorax. The cardiac silhouette is within limits. Atherosclerotic calcification of the aorta. Osteopenia with degenerative changes of the spine and shoulders. No acute osseous pathology. IMPRESSION: No active cardiopulmonary disease. Electronically Signed   By: 01/24/2020  M.D.   On: 04/21/2021 22:25   EP PPM/ICD IMPLANT  Result Date: 04/22/2021 Successful Micra AV leadless pacemaker implantation     Assessment and Recommendation  87 y.o. female with no evidence of previous cardiovascular history having acute onset of nausea and complete heart block with slow ventricular rate now status post Micra pacemaker placement without complication 1.  Continue recovery from pacemaker placement until tomorrow morning 2.  No further cardiac diagnostics  necessary at this time 3.  Patient may ambulate later today and possible discharge tomorrow if ambulating well with no further symptoms  Signed, Arnoldo Hooker M.D. FACC

## 2021-04-23 ENCOUNTER — Encounter: Payer: Self-pay | Admitting: Cardiology

## 2021-04-23 ENCOUNTER — Inpatient Hospital Stay
Admit: 2021-04-23 | Discharge: 2021-04-23 | Disposition: A | Discharge status: Home / Self Care | Payer: Medicare Other | Attending: Internal Medicine | Admitting: Internal Medicine

## 2021-04-23 DIAGNOSIS — R001 Bradycardia, unspecified: Secondary | ICD-10-CM | POA: Diagnosis not present

## 2021-04-23 LAB — BASIC METABOLIC PANEL
Anion gap: 5 (ref 5–15)
BUN: 16 mg/dL (ref 8–23)
CO2: 24 mmol/L (ref 22–32)
Calcium: 8.2 mg/dL — ABNORMAL LOW (ref 8.9–10.3)
Chloride: 109 mmol/L (ref 98–111)
Creatinine, Ser: 0.71 mg/dL (ref 0.44–1.00)
GFR, Estimated: >60 mL/min (ref >60)
Glucose, Bld: 108 mg/dL — ABNORMAL HIGH (ref 70–99)
Potassium: 3.6 mmol/L (ref 3.5–5.1)
Sodium: 138 mmol/L (ref 135–145)

## 2021-04-23 LAB — ECHOCARDIOGRAM COMPLETE
AR max vel: 2.55 cm2
AV Area VTI: 2.28 cm2
AV Area mean vel: 2.27 cm2
AV Mean grad: 7 mmHg
AV Peak grad: 10.8 mmHg
Ao pk vel: 1.64 m/s
Area-P 1/2: 3.46 cm2
Height: 68 in
MV VTI: 3.24 cm2
P 1/2 time: 584 msec
S' Lateral: 2.1 cm
Weight: 2261.04 oz

## 2021-04-23 MED ORDER — CEFDINIR 300 MG PO CAPS: 300.0000 mg | ORAL_CAPSULE | Freq: Two times a day (BID) | ORAL | 0 refills | Status: AC

## 2021-04-23 NOTE — Discharge Summary (Signed)
Physician Discharge Summary  Jane Bailey CNO:[id number] DOB: July 21, 1930 DOA: 04/21/2021  PCP: Drue Flirt, MD  Admit date: 04/21/2021 Discharge date: 04/23/2021  Admitted From: SNF Disposition: SNF  Recommendations for Outpatient Follow-up:  Follow up with PCP in 1 week Follow up with cardiology Please follow up on the following pending results: None   Discharge Condition: Stable CODE STATUS: Full code Diet recommendation: Regular diet   Brief/Interim Summary:  Admission HPI written by Londell Moh, DO  HPI: Jane Bailey is a 87 y.o. female with medical history significant for bradycardia, dementia, who presents emergency department for chief concerns of low heart rate.   At bedside patient was able to tell me that her name is Jane Bailey.  She was not able to tell me her age, she was not able to tell me her current location, and she does not know the current calendar year.   She does not appear to be in acute distress.  She denies hurting anywhere.  She specifically denies any chest pain or shortness of breath.   Social history: She lives in a nursing facility.   ROS: Unable to complete due to advanced dementia   Hospital course:  Symptomatic bradycardia Patient with HR in the 30-40s. Reports of falls at facility. Cardiology consulted and patient is s/p Micra pacemaker placement. Cleared for discharge per cardiology. Outpatient follow-up.   Hallucinations Acute metabolic encephalopathy Not baseline. Possibly related to delirium from dementia and/or possible infection. Concern for purulent urinary drainage. Resolved prior to discharge. Patient baseline.   Possible UTI Abnormal urinalysis with confusion. Urinalysis suggestive of infection. Empiric ceftriaxone initiated and urine culture obtained. Urine culture significant for gram negative rods prior to discharge. Final culture data pending. Discharge on Cefdinir   Dementia Patient is a resident at a  facility. At baseline, patient is oriented to self only.   Elevated troponin Concern for NSTEMI. Difficult to assess since patient is a poor historian. Possible demand ischemia related to severe/symptomatic bradycardia.   Discharge Diagnoses:  Principal Problem:   Symptomatic bradycardia Active Problems:   Dementia (HCC)   Bradycardia   Leukocytosis    Discharge Instructions  Discharge Instructions     Call MD for:  difficulty breathing, headache or visual disturbances   Complete by: As directed    Call MD for:  extreme fatigue   Complete by: As directed    Call MD for:  severe uncontrolled pain   Complete by: As directed    Call MD for:  temperature >100.4   Complete by: As directed       Allergies as of 04/23/2021       Reactions   Fosamax [alendronate]         Medication List     STOP taking these medications    bisacodyl 10 MG suppository Commonly known as: DULCOLAX   enoxaparin 40 MG/0.4ML injection Commonly known as: LOVENOX   Nyamyc powder Generic drug: nystatin   ondansetron 4 MG tablet Commonly known as: ZOFRAN   oxyCODONE 5 MG immediate release tablet Commonly known as: Oxy IR/ROXICODONE       TAKE these medications    acetaminophen 500 MG tablet Commonly known as: TYLENOL Take 2 tablets (1,000 mg total) by mouth every 8 (eight) hours.   bethanechol 10 MG tablet Commonly known as: URECHOLINE Take 1 tablet (10 mg total) by mouth 3 (three) times daily.   cefdinir 300 MG capsule Commonly known as: OMNICEF Take 1 capsule (300 mg total) by  mouth 2 (two) times daily for 4 days.   CERTA PLUS SENIOR PO Take 1 tablet by mouth daily.   docusate sodium 100 MG capsule Commonly known as: COLACE Take 1 capsule (100 mg total) by mouth 2 (two) times daily.   eucerin cream Apply topically as needed for dry skin. Apply topically once daily as needed, especially to legs.   feeding supplement Liqd Take 237 mLs by mouth 2 (two) times daily  between meals.        Follow-up Information     Lamar Blinks, MD Follow up in 1 week(s).   Specialty: Cardiology Contact information: [address] [place] [address] [phone]                Allergies  Allergen Reactions   Fosamax [Alendronate]     Consultations: Cardiology   Procedures/Studies: DG Chest 1 View  Result Date: 04/21/2021 CLINICAL DATA:  Nausea and vomiting and bradycardia. EXAM: CHEST  1 VIEW COMPARISON:  Chest radiograph dated 11/24/2019. FINDINGS: There is diffuse chronic interstitial coarsening. No focal consolidation, pleural effusion, pneumothorax. The cardiac silhouette is within limits. Atherosclerotic calcification of the aorta. Osteopenia with degenerative changes of the spine and shoulders. No acute osseous pathology. IMPRESSION: No active cardiopulmonary disease. Electronically Signed   By: Elgie Collard M.D.   On: 04/21/2021 22:25   EP PPM/ICD IMPLANT  Result Date: 04/22/2021 Successful Micra AV leadless pacemaker implantation      Subjective: No concerns this afternoon.  Discharge Exam: Vitals:   04/23/21 0800 04/23/21 1142  BP: (!) 154/66 (!) 152/70  Pulse: 65 64  Resp:    Temp: 97.7 F (36.5 C) 97.8 F (36.6 C)  SpO2: 99% 98%   Vitals:   04/23/21 0336 04/23/21 0500 04/23/21 0800 04/23/21 1142  BP: 139/76  (!) 154/66 (!) 152/70  Pulse: 67  65 64  Resp: 16     Temp: (!) 97.4 F (36.3 C)  97.7 F (36.5 C) 97.8 F (36.6 C)  TempSrc:      SpO2: 97%  99% 98%  Weight:  64.1 kg    Height:        General: Pt is alert, awake, not in acute distress Cardiovascular: RRR, S1/S2 +, no rubs, no gallops Respiratory: CTA bilaterally, no wheezing, no rhonchi Abdominal: Soft, NT, ND, bowel sounds + Extremities: no edema, no cyanosis    The results of significant diagnostics from this hospitalization (including imaging, microbiology, ancillary and laboratory) are listed  below for reference.     Microbiology: Recent Results (from the past 240 hour(s))  Resp Panel by RT-PCR (Flu A&B, Covid) Nasopharyngeal Swab     Status: None   Collection Time: 04/21/21 11:35 PM   Specimen: Nasopharyngeal Swab; Nasopharyngeal(NP) swabs in vial transport medium  Result Value Ref Range Status   SARS Coronavirus 2 by RT PCR NEGATIVE NEGATIVE Final    Comment: (NOTE) SARS-CoV-2 target nucleic acids are NOT DETECTED.  The SARS-CoV-2 RNA is generally detectable in upper respiratory specimens during the acute phase of infection. The lowest concentration of SARS-CoV-2 viral copies this assay can detect is 138 copies/mL. A negative result does not preclude SARS-Cov-2 infection and should not be used as the sole basis for treatment or other patient management decisions. A negative result may occur with  improper specimen collection/handling, submission of specimen other than nasopharyngeal swab, presence of viral mutation(s) within the areas targeted by this assay, and inadequate number of viral copies(<138 copies/mL). A negative  result must be combined with clinical observations, patient history, and epidemiological information. The expected result is Negative.  Fact Sheet for Patients:  BloggerCourse.com  Fact Sheet for Healthcare Providers:  SeriousBroker.it  This test is no t yet approved or cleared by the [place] FDA and  has been authorized for detection and/or diagnosis of SARS-CoV-2 by FDA under an Emergency Use Authorization (EUA). This EUA will remain  in effect (meaning this test can be used) for the duration of the COVID-19 declaration under Section 564(b)(1) of the Act, 21 U.S.C.section 360bbb-3(b)(1), unless the authorization is terminated  or revoked sooner.       Influenza A by PCR NEGATIVE NEGATIVE Final   Influenza B by PCR NEGATIVE NEGATIVE Final    Comment: (NOTE) The Xpert Xpress  SARS-CoV-2/FLU/RSV plus assay is intended as an aid in the diagnosis of influenza from Nasopharyngeal swab specimens and should not be used as a sole basis for treatment. Nasal washings and aspirates are unacceptable for Xpert Xpress SARS-CoV-2/FLU/RSV testing.  Fact Sheet for Patients: BloggerCourse.com  Fact Sheet for Healthcare Providers: SeriousBroker.it  This test is not yet approved or cleared by the [place] FDA and has been authorized for detection and/or diagnosis of SARS-CoV-2 by FDA under an Emergency Use Authorization (EUA). This EUA will remain in effect (meaning this test can be used) for the duration of the COVID-19 declaration under Section 564(b)(1) of the Act, 21 U.S.C. section 360bbb-3(b)(1), unless the authorization is terminated or revoked.  Performed at [facility], [address], [place], [place] [address]   Urine Culture     Status: Abnormal (Preliminary result)   Collection Time: 04/21/21 11:35 PM   Specimen: Urine, Catheterized  Result Value Ref Range Status   Specimen Description   Final    URINE, CATHETERIZED Performed at [facility], [address] [place], [place] [address]    Special Requests   Final    NONE Performed at [facility], [address] [place], [place] [address]    Culture (A)  Final    >=100,000 COLONIES/mL GRAM NEGATIVE RODS IDENTIFICATION AND SUSCEPTIBILITIES TO FOLLOW Performed at [facility], [address], [place], [place] [address]    Report Status PENDING  Incomplete     Labs: BNP (last 3 results) No results for input(s): BNP in the last 8760 hours. Basic Metabolic Panel: Recent Labs  Lab 04/21/21 2147 04/21/21 2335 04/22/21 1736 04/23/21 0557  NA 135  --  138 138  K 3.3*  --  3.6 3.6  CL 109  --  107 109  CO2 21*  --  24 24  GLUCOSE 139*  --  144* 108*  BUN 18  --  16 16  CREATININE 0.67  --  0.69 0.71   CALCIUM 8.4*  --  8.5* 8.2*  MG  --  2.0  --   --   PHOS  --  4.3  --   --    Liver Function Tests: Recent Labs  Lab 04/21/21 2147  AST 25  ALT 23  ALKPHOS 76  BILITOT 1.0  PROT 7.4  ALBUMIN 3.3*   Recent Labs  Lab 04/21/21 2147  LIPASE 28   No results for input(s): AMMONIA in the last 168 hours. CBC: Recent Labs  Lab 04/21/21 2147 04/22/21 1736  WBC 11.4* 7.5  HGB 11.8* 11.6*  HCT 36.3 35.6*  MCV 91.9 91.5  PLT 204 192   Cardiac Enzymes: No results for input(s): CKTOTAL,  CKMB, CKMBINDEX, TROPONINI in the last 168 hours. BNP: Invalid input(s): POCBNP CBG: No results for input(s): GLUCAP in the last 168 hours. D-Dimer No results for input(s): DDIMER in the last 72 hours. Hgb A1c No results for input(s): HGBA1C in the last 72 hours. Lipid Profile No results for input(s): CHOL, HDL, LDLCALC, TRIG, CHOLHDL, LDLDIRECT in the last 72 hours. Thyroid function studies Recent Labs    04/21/21 2335  TSH 2.560   Anemia work up No results for input(s): VITAMINB12, FOLATE, FERRITIN, TIBC, IRON, RETICCTPCT in the last 72 hours. Urinalysis    Component Value Date/Time   COLORURINE AMBER (A) 04/21/2021 2335   APPEARANCEUR HAZY (A) 04/21/2021 2335   LABSPEC 1.029 04/21/2021 2335   PHURINE 5.0 04/21/2021 2335   GLUCOSEU NEGATIVE 04/21/2021 2335   HGBUR NEGATIVE 04/21/2021 2335   BILIRUBINUR NEGATIVE 04/21/2021 2335   KETONESUR 5 (A) 04/21/2021 2335   PROTEINUR 100 (A) 04/21/2021 2335   NITRITE POSITIVE (A) 04/21/2021 2335   LEUKOCYTESUR NEGATIVE 04/21/2021 2335   Sepsis Labs Invalid input(s): PROCALCITONIN,  WBC,  LACTICIDVEN Microbiology Recent Results (from the past 240 hour(s))  Resp Panel by RT-PCR (Flu A&B, Covid) Nasopharyngeal Swab     Status: None   Collection Time: 04/21/21 11:35 PM   Specimen: Nasopharyngeal Swab; Nasopharyngeal(NP) swabs in vial transport medium  Result Value Ref Range Status   SARS Coronavirus 2 by RT PCR NEGATIVE NEGATIVE Final     Comment: (NOTE) SARS-CoV-2 target nucleic acids are NOT DETECTED.  The SARS-CoV-2 RNA is generally detectable in upper respiratory specimens during the acute phase of infection. The lowest concentration of SARS-CoV-2 viral copies this assay can detect is 138 copies/mL. A negative result does not preclude SARS-Cov-2 infection and should not be used as the sole basis for treatment or other patient management decisions. A negative result may occur with  improper specimen collection/handling, submission of specimen other than nasopharyngeal swab, presence of viral mutation(s) within the areas targeted by this assay, and inadequate number of viral copies(<138 copies/mL). A negative result must be combined with clinical observations, patient history, and epidemiological information. The expected result is Negative.  Fact Sheet for Patients:  BloggerCourse.com  Fact Sheet for Healthcare Providers:  SeriousBroker.it  This test is no t yet approved or cleared by the [place] FDA and  has been authorized for detection and/or diagnosis of SARS-CoV-2 by FDA under an Emergency Use Authorization (EUA). This EUA will remain  in effect (meaning this test can be used) for the duration of the COVID-19 declaration under Section 564(b)(1) of the Act, 21 U.S.C.section 360bbb-3(b)(1), unless the authorization is terminated  or revoked sooner.       Influenza A by PCR NEGATIVE NEGATIVE Final   Influenza B by PCR NEGATIVE NEGATIVE Final    Comment: (NOTE) The Xpert Xpress SARS-CoV-2/FLU/RSV plus assay is intended as an aid in the diagnosis of influenza from Nasopharyngeal swab specimens and should not be used as a sole basis for treatment. Nasal washings and aspirates are unacceptable for Xpert Xpress SARS-CoV-2/FLU/RSV testing.  Fact Sheet for Patients: BloggerCourse.com  Fact Sheet for Healthcare  Providers: SeriousBroker.it  This test is not yet approved or cleared by the [place] FDA and has been authorized for detection and/or diagnosis of SARS-CoV-2 by FDA under an Emergency Use Authorization (EUA). This EUA will remain in effect (meaning this test can be used) for the duration of the COVID-19 declaration under Section 564(b)(1) of the Act, 21 U.S.C. section 360bbb-3(b)(1), unless the authorization  is terminated or revoked.  Performed at [facility], [address], [place], [place] [address]   Urine Culture     Status: Abnormal (Preliminary result)   Collection Time: 04/21/21 11:35 PM   Specimen: Urine, Catheterized  Result Value Ref Range Status   Specimen Description   Final    URINE, CATHETERIZED Performed at [facility], [address], [place], [place] [address]    Special Requests   Final    NONE Performed at [facility], [address], [place], [place] [address]    Culture (A)  Final    >=100,000 COLONIES/mL GRAM NEGATIVE RODS IDENTIFICATION AND SUSCEPTIBILITIES TO FOLLOW Performed at [facility], [address], [place], [place] [address]    Report Status PENDING  Incomplete     Time coordinating discharge: 35 minutes  SIGNED:   Jacquelin Hawking, MD [facility] 04/23/2021, 1:57 PM

## 2021-04-23 NOTE — NC FL2 (Signed)
[place] MEDICAID FL2 LEVEL OF CARE SCREENING TOOL     IDENTIFICATION  Patient Name: Jane Bailey Birthdate: May 29, 1930 Sex: female Admission Date (Current Location): 04/21/2021  [facility] and [place] Number:  Chiropodist and Address:  [facility], [address], [place], [place] [address]      Provider Number: [id number]  Attending Physician Name and Address:  Narda Bonds, MD  Relative Name and Phone Number:       Current Level of Care: Hospital Recommended Level of Care: Skilled Nursing Facility Prior Approval Number:    Date Approved/Denied:   PASRR Number:    Discharge Plan: SNF    Current Diagnoses: Patient Active Problem List   Diagnosis Date Noted   Symptomatic bradycardia 04/21/2021   Leukocytosis 04/21/2021   Fall 11/23/2019   Dementia (HCC) 11/23/2019   Closed displaced fracture of right femoral neck (HCC) 11/23/2019   Bradycardia 11/23/2019    Orientation RESPIRATION BLADDER Height & Weight     Self, Place, Situation  Normal Continent Weight: 141 lb 5 oz (64.1 kg) Height:  5\' 8"  (172.7 cm)  BEHAVIORAL SYMPTOMS/MOOD NEUROLOGICAL BOWEL NUTRITION STATUS      Incontinent Diet (heart healthy, thin liquids)  AMBULATORY STATUS COMMUNICATION OF NEEDS Skin   Extensive Assist Verbally Normal                       Personal Care Assistance Level of Assistance  Bathing, Feeding, Dressing Bathing Assistance: Maximum assistance Feeding assistance: Independent Dressing Assistance: Maximum assistance     Functional Limitations Info  Sight, Hearing, Speech Sight Info: Adequate Hearing Info: Adequate Speech Info: Adequate    SPECIAL CARE FACTORS FREQUENCY  PT (By licensed PT), OT (By licensed OT)     PT Frequency: 5x OT Frequency: 5x            Contractures Contractures Info: Not present    Additional Factors Info  Code Status, Allergies Code Status Info: full code Allergies Info: Fosamax  (Alendronate)           Current Medications (04/23/2021):  This is the current hospital active medication list Current Facility-Administered Medications  Medication Dose Route Frequency Provider Last Rate Last Admin   0.45 % sodium chloride infusion   Intravenous Continuous 04/25/2021, MD       0.9 %  sodium chloride infusion   Intravenous Continuous Lamar Blinks, MD       0.9 %  sodium chloride infusion  250 mL Intravenous PRN Paraschos, Alexander, MD       0.9 %  sodium chloride infusion   Intravenous Continuous Lamar Blinks, MD 75 mL/hr at 04/22/21 2201 New Bag at 04/22/21 2201   acetaminophen (TYLENOL) tablet 650 mg  650 mg Oral Q4H PRN 2202, MD   650 mg at 04/22/21 2204   bisacodyl (DULCOLAX) suppository 10 mg  10 mg Rectal Daily PRN Cox, Amy N, DO       cefTRIAXone (ROCEPHIN) 1 g in sodium chloride 0.9 % 100 mL IVPB  1 g Intravenous Q24H 2205, MD   Stopped at 04/22/21 1703   feeding supplement (ENSURE ENLIVE / ENSURE PLUS) liquid 237 mL  237 mL Oral BID BM Cox, Amy N, DO       fentaNYL (SUBLIMAZE) injection    PRN 04/24/21, MD   25 mcg at 04/22/21 04/24/21   Heparin (Porcine) in NaCl 1000-0.9 UT/500ML-% SOLN    PRN  Marcina Millard, MD   500 mL at 04/22/21 0940   heparin sodium (porcine) injection    PRN Marcina Millard, MD   3,500 Units at 04/22/21 0934   hydrocerin (EUCERIN) cream   Topical PRN Cox, Amy N, DO       iohexol (OMNIPAQUE) 300 MG/ML solution    PRN Paraschos, Alexander, MD   15 mL at 04/22/21 1005   lidocaine (PF) (XYLOCAINE) 1 % injection    PRN Paraschos, Alexander, MD   30 mL at 04/22/21 0927   midazolam (VERSED) injection    PRN Marcina Millard, MD   0.5 mg at 04/22/21 0922   ondansetron (ZOFRAN) injection 4 mg  4 mg Intravenous Q6H PRN Paraschos, Alexander, MD       ondansetron (ZOFRAN) tablet 4 mg  4 mg Oral Q6H PRN Cox, Amy N, DO       sodium chloride flush (NS) 0.9 % injection 3 mL  3 mL Intravenous  Q12H Paraschos, Alexander, MD   3 mL at 04/22/21 2158   sodium chloride flush (NS) 0.9 % injection 3 mL  3 mL Intravenous PRN Paraschos, Alexander, MD         Discharge Medications: Please see discharge summary for a list of discharge medications.  Relevant Imaging Results:  Relevant Lab Results:   Additional Information SSN:[id number]  Reuel Boom Shilpa Bushee, LCSW

## 2021-04-23 NOTE — Progress Notes (Signed)
*  PRELIMINARY RESULTS* Echocardiogram 2D Echocardiogram has been performed.  Jane Bailey 04/23/2021, 8:21 AM

## 2021-04-23 NOTE — Discharge Instructions (Signed)
Please avoid showering/submerging yourself in water for the next week. If your bandage gets wet or starts to fall off, replace it with another piece of gauze and the Tegaderm (clear bandage I provided). You should try to keep it covered for a week. You will follow up with Dr. Kowalski in the office in about 1 week. If you have bleeding from the site, lie down and apply firm pressure for 20 minutes, if it continues to bleed, call our office ([phone]). Avoid heavy lifting, squatting (other than sitting) and strenuous activity for 1 week. You will get a call from [employer] to set up your pacemaker and the [employer] device. You do not need to bring this device with you to appointments. It is used to monitor your pacemaker from home.  

## 2021-04-23 NOTE — Progress Notes (Signed)
[facility] Cardiology    SUBJECTIVE: Patient seen and examined this morning.  Appears stable from a cardiac standpoint.  Suture removed at bedside without untoward event and placed dry gauze and Tegaderm.    Vitals:   04/23/21 0336 04/23/21 0500 04/23/21 0800 04/23/21 1142  BP: 139/76  (!) 154/66 (!) 152/70  Pulse: 67  65 64  Resp: 16     Temp: (!) 97.4 F (36.3 C)  97.7 F (36.5 C) 97.8 F (36.6 C)  TempSrc:      SpO2: 97%  99% 98%  Weight:  64.1 kg    Height:         Intake/Output Summary (Last 24 hours) at 04/23/2021 1306 Last data filed at 04/23/2021 1127 Gross per 24 hour  Intake 600 ml  Output 1625 ml  Net -1025 ml     PHYSICAL EXAM  General: Pleasant elderly Caucasian female, well nourished, in no acute distress. HEENT:  Normocephalic and atraumatic. Neck:  No JVD.  Lungs: Normal respiratory effort on room air.  Heart: Ventricular paced rhythm Abdomen: Non-distended appearing.  Msk: Normal strength and tone for age. Extremities: No clubbing, cyanosis or edema.   Neuro: Alert and oriented to self, confused at baseline. Psych:  Mood appropriate, affect congruent.  Incision: Right groin 0 silk suture removed without complication.  Site is without erythema, tenderness, bleeding, or aneurysm.  Dry 2 x 2 gauze and Tegaderm in place.   LABS: Basic Metabolic Panel: Recent Labs    04/21/21 2335 04/22/21 1736 04/23/21 0557  NA  --  138 138  K  --  3.6 3.6  CL  --  107 109  CO2  --  24 24  GLUCOSE  --  144* 108*  BUN  --  16 16  CREATININE  --  0.69 0.71  CALCIUM  --  8.5* 8.2*  MG 2.0  --   --   PHOS 4.3  --   --    Liver Function Tests: Recent Labs    04/21/21 2147  AST 25  ALT 23  ALKPHOS 76  BILITOT 1.0  PROT 7.4  ALBUMIN 3.3*   Recent Labs    04/21/21 2147  LIPASE 28   CBC: Recent Labs    04/21/21 2147 04/22/21 1736  WBC 11.4* 7.5  HGB 11.8* 11.6*  HCT 36.3 35.6*  MCV 91.9 91.5  PLT 204 192   Cardiac Enzymes: No results for  input(s): CKTOTAL, CKMB, CKMBINDEX, TROPONINI in the last 72 hours. BNP: Invalid input(s): POCBNP D-Dimer: No results for input(s): DDIMER in the last 72 hours. Hemoglobin A1C: No results for input(s): HGBA1C in the last 72 hours. Fasting Lipid Panel: No results for input(s): CHOL, HDL, LDLCALC, TRIG, CHOLHDL, LDLDIRECT in the last 72 hours. Thyroid Function Tests: Recent Labs    04/21/21 2335  TSH 2.560   Anemia Panel: No results for input(s): VITAMINB12, FOLATE, FERRITIN, TIBC, IRON, RETICCTPCT in the last 72 hours.  DG Chest 1 View  Result Date: 04/21/2021 CLINICAL DATA:  Nausea and vomiting and bradycardia. EXAM: CHEST  1 VIEW COMPARISON:  Chest radiograph dated 11/24/2019. FINDINGS: There is diffuse chronic interstitial coarsening. No focal consolidation, pleural effusion, pneumothorax. The cardiac silhouette is within limits. Atherosclerotic calcification of the aorta. Osteopenia with degenerative changes of the spine and shoulders. No acute osseous pathology. IMPRESSION: No active cardiopulmonary disease. Electronically Signed   By: Elgie Collard M.D.   On: 04/21/2021 22:25   EP PPM/ICD IMPLANT  Result Date: 04/22/2021 Successful Micra  AV leadless pacemaker implantation      ASSESSMENT AND PLAN:  Principal Problem:   Symptomatic bradycardia Active Problems:   Dementia (HCC)   Bradycardia   Leukocytosis   87 year old female with no prior cardiovascular history presented to [facility] ED with nausea and complete heart block, slow ventricular rate.  She underwent Micra pacemaker placement with Dr. Marcina Millard 12/29 and is stable from a cardiac standpoint  #Symptomatic bradycardia #Complete heart block s/p Micra leadless pacemaker placement Patient is now ventricularly paced and stable for discharge from a cardiac standpoint, provided she is ambulating well.  No further cardiac diagnostics necessary at this time. She should follow-up in office with Dr. Gwen Pounds in 1  to 2 weeks or sooner if needed.  Please contact on-call physician Dr. Sena Slate for any further questions through the weekend if patient is not discharged today.  Rebeca Allegra, PA-C 04/23/2021 1:06 PM

## 2021-04-23 NOTE — TOC Transition Note (Signed)
Transition of Care [facility]) - CM/SW Discharge Note   Patient Details  Name: Jane Bailey MRN: [id number] Date of Birth: 06-23-1930  Transition of Care [facility] CM/SW Contact:  Maree Krabbe, LCSW Phone Number: 04/23/2021, 2:27 PM   Clinical Narrative: Clinical Social Worker facilitated patient discharge including contacting patient family and facility to confirm patient discharge plans.  Clinical information faxed to facility and family agreeable with plan.  CSW arranged ambulance transport via [facility] to [facility] .  RN to call [phone] for report prior to discharge.        Final next level of care: Skilled Nursing Facility Barriers to Discharge: No Barriers Identified   Patient Goals and CMS Choice Patient states their goals for this hospitalization and ongoing recovery are:: for pt to return to snf   Choice offered to / list presented to : Adult Children, [facility] POA / Guardian  Discharge Placement              [facility] Patient to be transferred to facility by: [facility]   Patient and family notified of of transfer: 04/23/21  Discharge Plan and Services     Post Acute Care Choice: Skilled Nursing Facility                               Social Determinants of Health (SDOH) Interventions     Readmission Risk Interventions No flowsheet data found.

## 2021-04-23 NOTE — TOC Initial Note (Signed)
Transition of Care [facility]) - Initial/Assessment Note    Patient Details  Name: Jane Bailey MRN: [id number] Date of Birth: [birth year]  Transition of Care [facility] CM/SW Contact:    Maree Krabbe, LCSW Phone Number: 04/23/2021, 1:04 PM  Clinical Narrative:     CSW spoke with pt's daughter/POA. Pt's daughter confirmed pt is from [facility] and will return at d/c. CSW has sent referral to [facility].              Expected Discharge Plan: Skilled Nursing Facility Barriers to Discharge: Continued Medical Work up   Patient Goals and CMS Choice Patient states their goals for this hospitalization and ongoing recovery are:: for pt to return to snf   Choice offered to / list presented to : Adult Children, [facility] POA / Guardian  Expected Discharge Plan and Services Expected Discharge Plan: Skilled Nursing Facility     Post Acute Care Choice: Skilled Nursing Facility Living arrangements for the past 2 months: Single Family Home                                      Prior Living Arrangements/Services Living arrangements for the past 2 months: Single Family Home Lives with:: Facility Resident Patient language and need for interpreter reviewed:: Yes Do you feel safe going back to the place where you live?: Yes      Need for Family Participation in Patient Care: Yes (Comment) Care giver support system in place?: Yes (comment)   Criminal Activity/Legal Involvement Pertinent to Current Situation/Hospitalization: No - Comment as needed  Activities of Daily Living Home Assistive Devices/Equipment: Walker (specify type) ADL Screening (condition at time of admission) Patient's cognitive ability adequate to safely complete daily activities?: No Is the patient deaf or have difficulty hearing?: No Does the patient have difficulty seeing, even when wearing glasses/contacts?: No Does the patient have difficulty concentrating, remembering, or making decisions?: Yes Patient able to express need  for assistance with ADLs?: Yes Does the patient have difficulty dressing or bathing?: No Independently performs ADLs?: Yes (appropriate for developmental age) Does the patient have difficulty walking or climbing stairs?: No Weakness of Legs: None Weakness of Arms/Hands: None  Permission Sought/Granted Permission sought to share information with : Family Supports Permission granted to share information with : Yes, Release of Information Signed  Share Information with NAME: Marylene Land  Permission granted to share info w AGENCY: [facility]  Permission granted to share info w Relationship: daughter/ POA     Emotional Assessment Appearance:: Appears stated age Attitude/Demeanor/Rapport: Unable to Assess Affect (typically observed): Unable to Assess Orientation: : Oriented to Self, Oriented to Place, Oriented to Situation Alcohol / Substance Use: Not Applicable Psych Involvement: No (comment)  Admission diagnosis:  Hypokalemia [E87.6] Bradycardia [R00.1] Third degree AV block (HCC) [I44.2] Symptomatic bradycardia [R00.1] Patient Active Problem List   Diagnosis Date Noted   Symptomatic bradycardia 04/21/2021   Leukocytosis 04/21/2021   Fall 11/23/2019   Dementia (HCC) 11/23/2019   Closed displaced fracture of right femoral neck (HCC) 11/23/2019   Bradycardia 11/23/2019   PCP:  Drue Flirt, MD Pharmacy:   [facility] - [place], [place] - [address] [place] [address] Phone: [phone] Fax: [phone]     Social Determinants of Health (SDOH) Interventions    Readmission Risk Interventions No flowsheet data found.

## 2021-04-24 LAB — URINE CULTURE: Culture: >=100000 — AB

## 2021-08-19 ENCOUNTER — Emergency Department: Payer: Medicare Other

## 2021-08-19 ENCOUNTER — Encounter: Payer: Self-pay | Admitting: Emergency Medicine

## 2021-08-19 ENCOUNTER — Emergency Department
Admission: EM | Admit: 2021-08-19 | Discharge: 2021-08-19 | Disposition: A | Discharge status: Home / Self Care | Payer: Medicare Other | Attending: Emergency Medicine | Admitting: Emergency Medicine

## 2021-08-19 ENCOUNTER — Other Ambulatory Visit: Payer: Self-pay

## 2021-08-19 DIAGNOSIS — Z950 Presence of cardiac pacemaker: Secondary | ICD-10-CM | POA: Insufficient documentation

## 2021-08-19 DIAGNOSIS — F0390 Unspecified dementia without behavioral disturbance: Secondary | ICD-10-CM | POA: Insufficient documentation

## 2021-08-19 DIAGNOSIS — S86911A Strain of unspecified muscle(s) and tendon(s) at lower leg level, right leg, initial encounter: Secondary | ICD-10-CM | POA: Insufficient documentation

## 2021-08-19 DIAGNOSIS — S8991XA Unspecified injury of right lower leg, initial encounter: Secondary | ICD-10-CM | POA: Diagnosis present

## 2021-08-19 DIAGNOSIS — T148XXA Other injury of unspecified body region, initial encounter: Secondary | ICD-10-CM

## 2021-08-19 DIAGNOSIS — M79604 Pain in right leg: Secondary | ICD-10-CM | POA: Diagnosis not present

## 2021-08-19 DIAGNOSIS — X58XXXA Exposure to other specified factors, initial encounter: Secondary | ICD-10-CM | POA: Insufficient documentation

## 2021-08-19 LAB — CBC WITH DIFFERENTIAL/PLATELET
Abs Immature Granulocytes: 0.06 10*3/uL (ref 0.00–0.07)
Basophils Absolute: 0.1 10*3/uL (ref 0.0–0.1)
Basophils Relative: 1 %
Eosinophils Absolute: 0.3 10*3/uL (ref 0.0–0.5)
Eosinophils Relative: 3 %
HCT: 38.7 % (ref 36.0–46.0)
Hemoglobin: 12.7 g/dL (ref 12.0–15.0)
Immature Granulocytes: 1 %
Lymphocytes Relative: 15 %
Lymphs Abs: 1.4 10*3/uL (ref 0.7–4.0)
MCH: 29.7 pg (ref 26.0–34.0)
MCHC: 32.8 g/dL (ref 30.0–36.0)
MCV: 90.6 fL (ref 80.0–100.0)
Monocytes Absolute: 1 10*3/uL (ref 0.1–1.0)
Monocytes Relative: 10 %
Neutro Abs: 6.9 10*3/uL (ref 1.7–7.7)
Neutrophils Relative %: 70 %
Platelets: 245 10*3/uL (ref 150–400)
RBC: 4.27 MIL/uL (ref 3.87–5.11)
RDW: 13.2 % (ref 11.5–15.5)
WBC: 9.7 10*3/uL (ref 4.0–10.5)
nRBC: 0 % (ref 0.0–0.2)

## 2021-08-19 LAB — CK: Total CK: 48 U/L (ref 38–234)

## 2021-08-19 LAB — BASIC METABOLIC PANEL
Anion gap: 9 (ref 5–15)
BUN: 14 mg/dL (ref 8–23)
CO2: 25 mmol/L (ref 22–32)
Calcium: 9 mg/dL (ref 8.9–10.3)
Chloride: 102 mmol/L (ref 98–111)
Creatinine, Ser: 0.53 mg/dL (ref 0.44–1.00)
GFR, Estimated: >60 mL/min (ref >60)
Glucose, Bld: 122 mg/dL — ABNORMAL HIGH (ref 70–99)
Potassium: 3.7 mmol/L (ref 3.5–5.1)
Sodium: 136 mmol/L (ref 135–145)

## 2021-08-19 LAB — MAGNESIUM: Magnesium: 1.9 mg/dL (ref 1.7–2.4)

## 2021-08-19 MED ORDER — NYSTATIN 100000 UNIT/GM EX POWD: 1.0000 "application " | Freq: Three times a day (TID) | CUTANEOUS | 0 refills | Status: AC

## 2021-08-19 MED ORDER — LIDOCAINE 5 % EX PTCH: 1.0000 | MEDICATED_PATCH | Freq: Two times a day (BID) | CUTANEOUS | 0 refills | Status: AC

## 2021-08-19 MED ORDER — LIDOCAINE 5 % EX PTCH
1.0000 | MEDICATED_PATCH | CUTANEOUS | Status: DC
  Administered 2021-08-19: 1 via TRANSDERMAL
  Filled 2021-08-19: qty 1

## 2021-08-19 MED ORDER — OXYCODONE-ACETAMINOPHEN 5-325 MG PO TABS
1.0000 | ORAL_TABLET | Freq: Once | ORAL | Status: AC
  Administered 2021-08-19: 1 via ORAL
  Filled 2021-08-19: qty 1

## 2021-08-19 NOTE — ED Triage Notes (Signed)
Pt presents via EMS from [facility] with complaints of right thigh pain starting tonight. Pt denies any falls or injury to that area. Of note, EMS mentioned that she had a pacemaker placed through her right femoral artery - pt is a poor historian and is unable to recall if this is true. Denies CP or SOB. ?

## 2021-08-19 NOTE — ED Notes (Signed)
Pt wheeled to lobby and placed in car with son, son to transport to [facility]. Son given paperwork.  ?Dr Charna Archer notified of rash under pt's breasts, rx called in to pt's pharmacy. This RN attempted to call report x 2 to [facility], no answer.  ?

## 2021-08-19 NOTE — ED Provider Notes (Signed)
? ?[facility] ?Provider Note ? ? ? Event Date/Time  ? First MD Initiated Contact with Patient 08/19/21 0701   ?  (approximate) ? ? ?History  ? ?Chief Complaint ?Leg Pain ? ? ?HPI ? ?GALE KLAR is a 88 y.o. female with past medical history of dementia and bradycardia status post pacemaker who presents to the ED complaining of leg pain.  History is limited due to patient's dementia, however she is at her baseline mental status per EMS.  EMS reports that patient began complaining of pain in her right leg overnight last night.  She reports sharp pain in her anterior and medial right thigh, where she is sore to touch.  She is not aware of any recent falls or other trauma, has not noticed any redness to the skin or swelling in the leg.  She denies any history of similar symptoms, but staff at her nursing facility did report patient had pacemaker placed via right femoral artery a few months ago.  Patient does not take a blood thinner and denies any history of DVT/PE. ?  ? ? ?Physical Exam  ? ?Triage Vital Signs: ?ED Triage Vitals  ?Enc Vitals Group  ?   BP 08/19/21 0652 (!) 179/98  ?   Pulse Rate 08/19/21 0651 83  ?   Resp 08/19/21 0651 20  ?   Temp 08/19/21 0651 98.7 ?F (37.1 ?C)  ?   Temp src --   ?   SpO2 08/19/21 0651 100 %  ?   Weight 08/19/21 0649 143 lb 4.8 oz (65 kg)  ?   Height 08/19/21 0649 5\' 8"  (1.727 m)  ?   Head Circumference --   ?   Peak Flow --   ?   Pain Score 08/19/21 0649 3  ?   Pain Loc --   ?   Pain Edu? --   ?   Excl. in GC? --   ? ? ?Most recent vital signs: ?Vitals:  ? 08/19/21 0918 08/19/21 0920  ?BP: (!) 150/79   ?Pulse: 73 74  ?Resp: (!) 22 19  ?Temp:    ?SpO2: 98% 97%  ? ? ?Constitutional: Alert and oriented. ?Eyes: Conjunctivae are normal. ?Head: Atraumatic. ?Nose: No congestion/rhinnorhea. ?Mouth/Throat: Mucous membranes are moist.  ?Cardiovascular: Normal rate, regular rhythm. Grossly normal heart sounds.  2+ radial and DP pulses bilaterally. ?Respiratory:  Normal respiratory effort.  No retractions. Lungs CTAB. ?Gastrointestinal: Soft and nontender. No distention. ?Musculoskeletal: Tenderness to palpation noted over the middle of anterior and medial portion of right thigh with no overlying erythema or warmth.  No calf edema or tenderness noted.  Range of motion intact at right hip, knee, and ankle without significant pain. ?Neurologic:  Normal speech and language. No gross focal neurologic deficits are appreciated. ? ? ? ?ED Results / Procedures / Treatments  ? ?Labs ?(all labs ordered are listed, but only abnormal results are displayed) ?Labs Reviewed  ?BASIC METABOLIC PANEL - Abnormal; Notable for the following components:  ?    Result Value  ? Glucose, Bld 122 (*)   ? All other components within normal limits  ?CBC WITH DIFFERENTIAL/PLATELET  ?CK  ?MAGNESIUM  ? ? ? ?EKG ? ?ED ECG REPORT ?08/21/21, the attending physician, personally viewed and interpreted this ECG. ? ? Date: 08/19/2021 ? EKG Time: 6:51 ? Rate: 84 ? Rhythm: normal sinus rhythm ? Axis: Normal ? Intervals: Borderline prolonged QT ? ST&T Change: None ? ?RADIOLOGY ?Right hip x-ray reviewed  by me with no fracture or dislocation. ? ?PROCEDURES: ? ?Critical Care performed: No ? ?Procedures ? ? ?MEDICATIONS ORDERED IN ED: ?Medications  ?lidocaine (LIDODERM) 5 % 1 patch (1 patch Transdermal Patch Applied 08/19/21 0916)  ?oxyCODONE-acetaminophen (PERCOCET/ROXICET) 5-325 MG per tablet 1 tablet (1 tablet Oral Given 08/19/21 0731)  ? ? ? ?IMPRESSION / MDM / ASSESSMENT AND PLAN / ED COURSE  ?I reviewed the triage vital signs and the nursing notes. ?             ?               ? ?88 y.o. female with past medical history of dementia and bradycardia presents to the ED complaining of right anterior and medial thigh pain starting overnight last night with no recent trauma. ? ?Differential diagnosis includes, but is not limited to, arterial insufficiency, DVT, cellulitis, muscle strain, fracture. ? ?Patient  nontoxic-appearing and in no acute distress, vital signs are unremarkable and she is neurovascular intact to her distal right lower extremity.  There are no signs of trauma and range of motion is intact throughout the leg, doubt bony injury.  We will further assess with ultrasound for DVT.  No findings to suggest cellulitis or other infectious process.  EKG is remarkable only for borderline prolonged QT, we will screen labs including magnesium and CK level.  Plan to treat symptomatically with Percocet and reassess, if work-up is unremarkable suspect muscle strain. ? ?Ultrasound is negative for DVT, labs are also reassuring with BMP showing no electrolyte abnormality or AKI, magnesium within normal limits, CK although within normal limits with no findings to suggest rhabdomyolysis.  Family felt most comfortable obtaining x-ray of right hip given her prior surgery, this was obtained and unremarkable.  Patient with improvement in pain following Percocet and Lidoderm patch, is appropriate for discharge home with PCP follow-up for suspected muscle strain.  Son at bedside was counseled to have her return to the ED for new worsening symptoms, patient and family agree with plan. ? ?  ? ? ?FINAL CLINICAL IMPRESSION(S) / ED DIAGNOSES  ? ?Final diagnoses:  ?Right leg pain  ?Muscle strain  ? ? ? ?Rx / DC Orders  ? ?ED Discharge Orders   ? ?      Ordered  ?  lidocaine (LIDODERM) 5 %  Every 12 hours       ? 08/19/21 0950  ? ?  ?  ? ?  ? ? ? ?Note:  This document was prepared using Dragon voice recognition software and may include unintentional dictation errors. ?  ?Chesley Noon, MD ?08/19/21 [phone] ? ?

## 2021-08-19 NOTE — ED Notes (Signed)
Pt with c/o pain in right thigh, no redness or swelling noted on right thigh. Strong right pedal pulse, no edema noted in right leg.  ?

## 2021-08-19 NOTE — ED Notes (Signed)
Pt helped to bedside commode, pt with wet brief. Brief changed and pericare done.  ?

## 2021-09-03 IMAGING — CT CT HEAD W/O CM
4 of 5 series · 16 of 47 positions shown, 18 images · non-contrast
Comparison: None.

CLINICAL DATA: Fell this morning.  Patient with baseline dementia.

EXAM:
CT HEAD WITHOUT CONTRAST
TECHNIQUE: Contiguous axial images were obtained from the base of the skull
through the vertex without intravenous contrast.

[Series 2: head wo · axial · 0.42mm/px · z∈[-163,-63]mm · 6 of 30 slices shown, 8 images]
[im 5/30  brain]
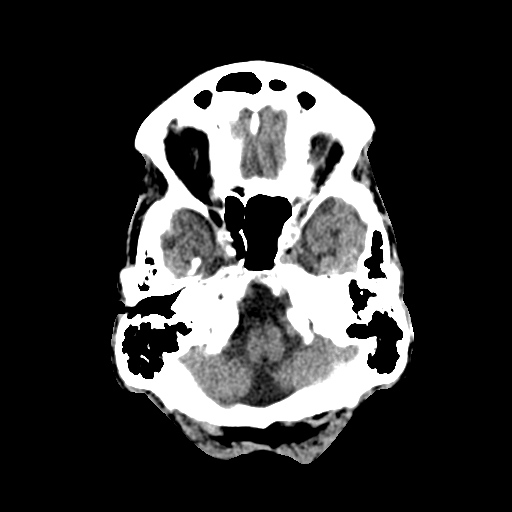
[im 5/30  bone]
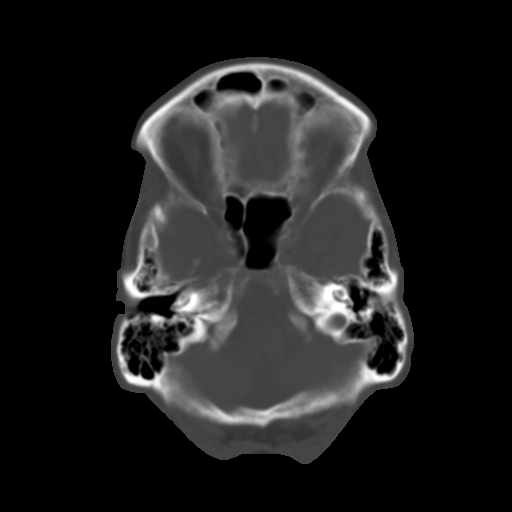
[im 9/30  brain]
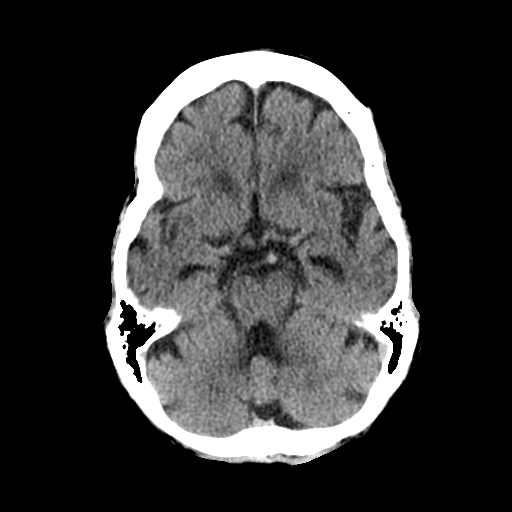
[im 13/30  brain]
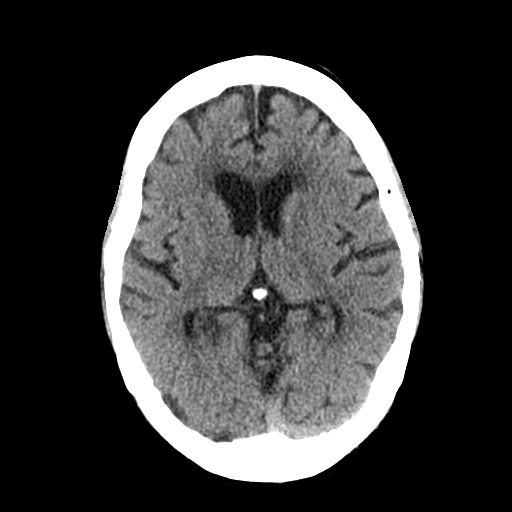
[im 17/30  brain]
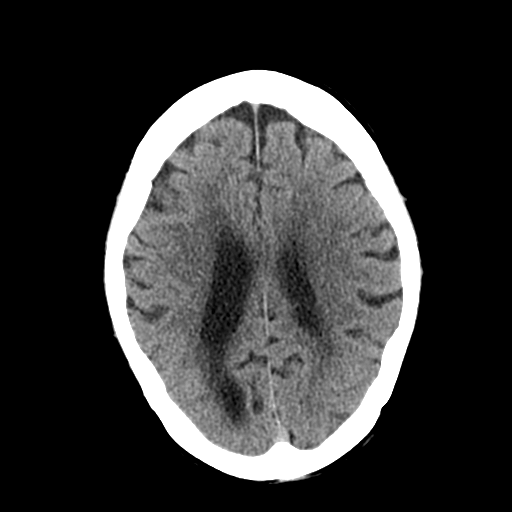
[im 21/30  brain]
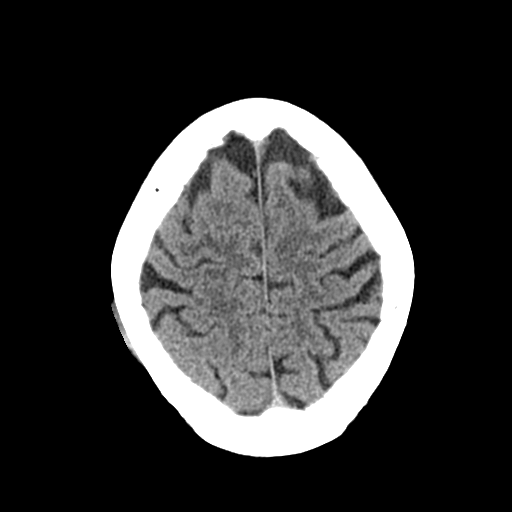
[im 21/30  bone]
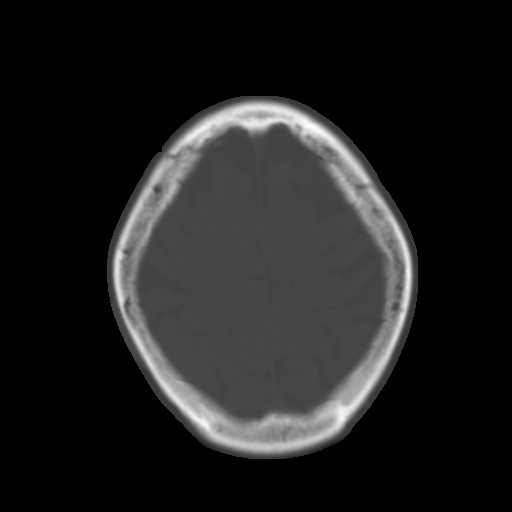
[im 25/30  brain]
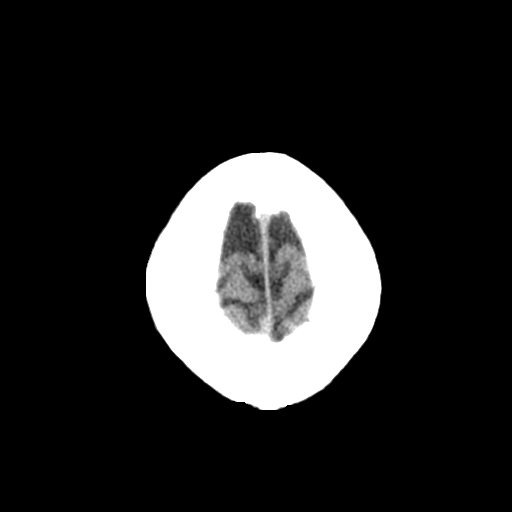

[Series 3: head bone · axial · 0.42mm/px · z∈[-169,-121]mm · 4 of 75 slices shown]
[im 8/75  bone]
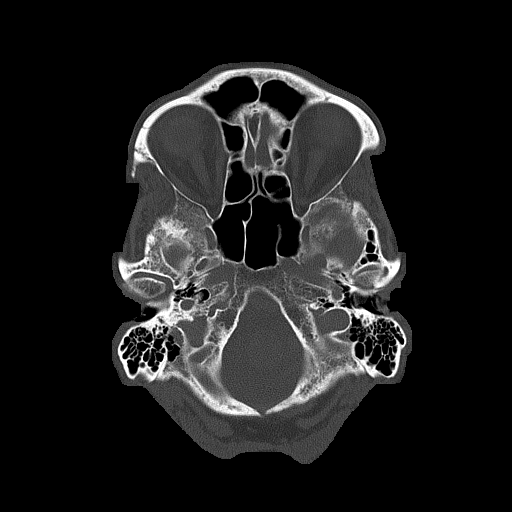
[im 16/75  bone]
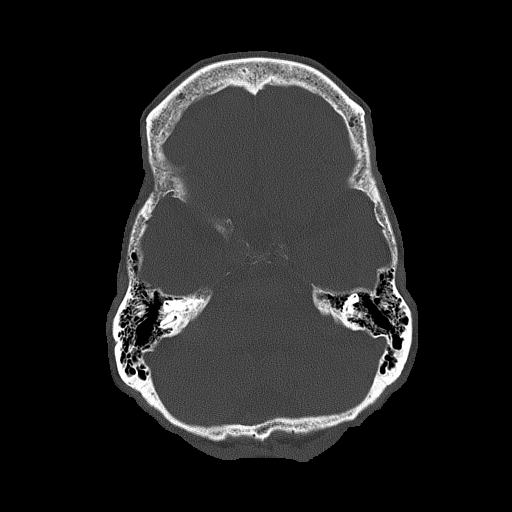
[im 24/75  bone]
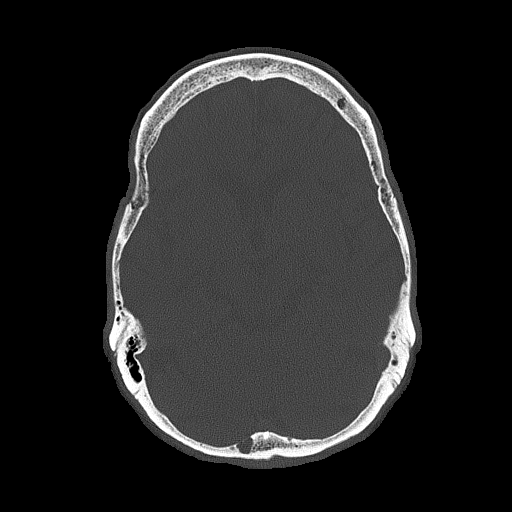
[im 32/75  bone]
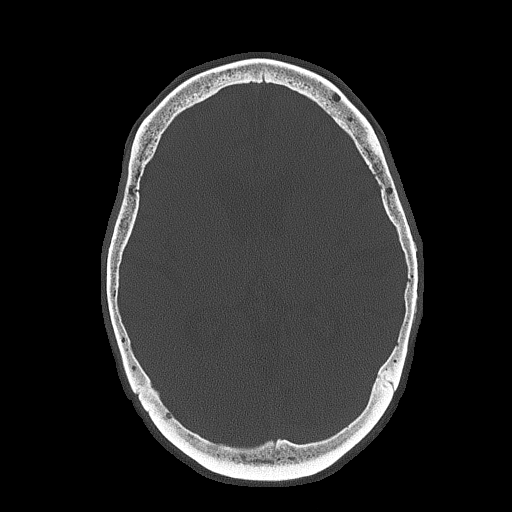

[Series 4: coronal soft tissue · coronal · 0.31mm/px · 3 of 67 slices shown]
[im 23/67  brain]
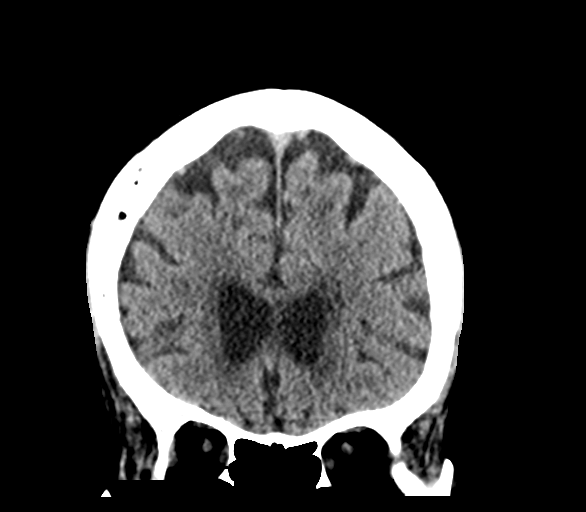
[im 30/67  brain]
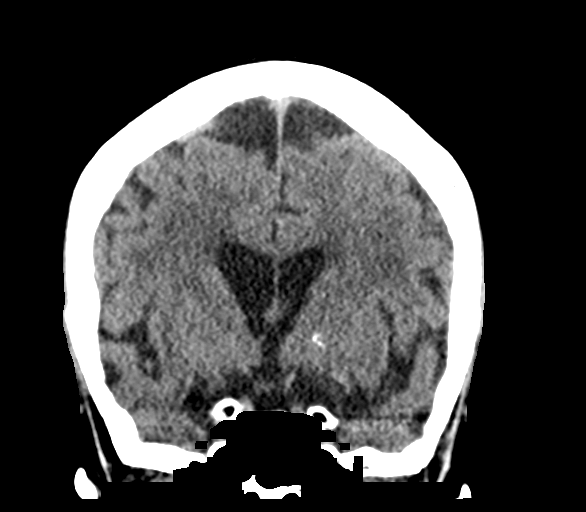
[im 37/67  brain]
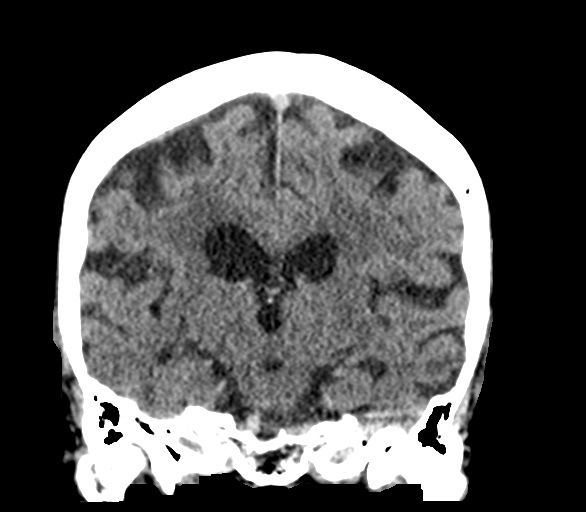

[Series 5: sagittal soft tissue · sagittal · 0.30mm/px · 3 of 57 slices shown]
[im 19/57  brain]
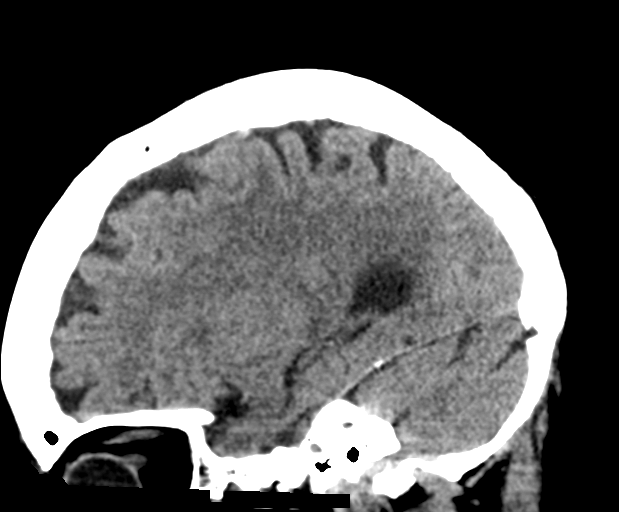
[im 29/57  brain]
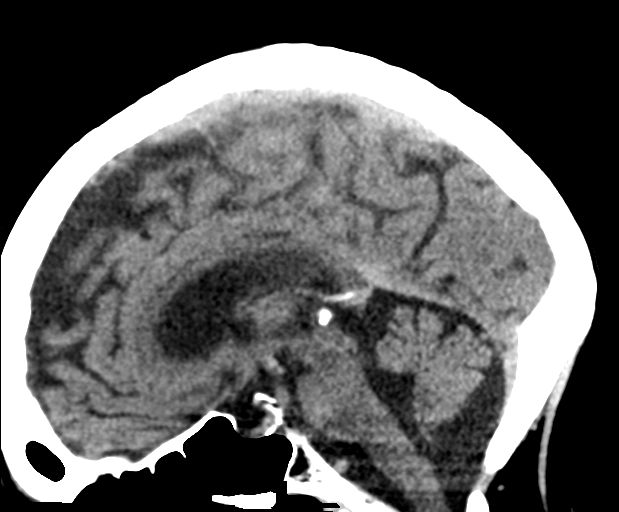
[im 38/57  brain]
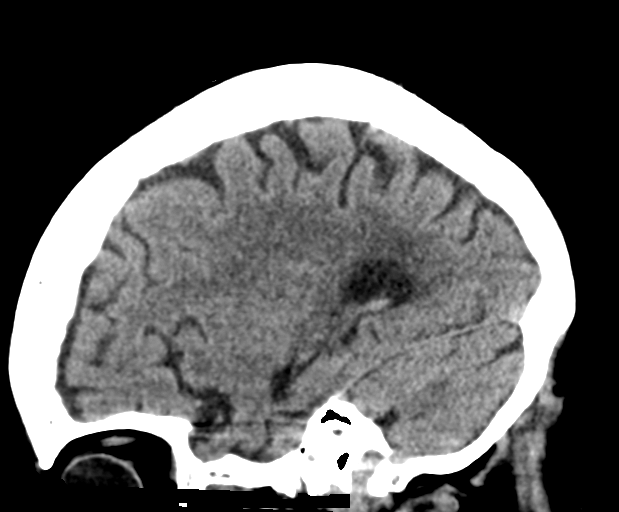

[16 of 47 positions shown; findings below may reference images not displayed]

FINDINGS: Brain: No evidence of acute infarction, hemorrhage, hydrocephalus,
extra-axial collection or mass lesion/mass effect.

Mild patchy areas of white matter hypoattenuation are noted
consistent with chronic microvascular ischemic change.

Vascular: No hyperdense vessel or unexpected calcification.

Skull: Normal. Negative for fracture or focal lesion.

Sinuses/Orbits: Visualized globes and orbits are unremarkable. The
visualized sinuses and mastoid air cells are clear.

Other: Small right parietal scalp hematoma.
IMPRESSION: 1. No acute intracranial abnormalities.
2. Small right parietal scalp hematoma.  No skull fracture

## 2023-12-17 ENCOUNTER — Emergency Department

## 2023-12-17 ENCOUNTER — Other Ambulatory Visit: Payer: Self-pay

## 2023-12-17 ENCOUNTER — Emergency Department
Admission: EM | Admit: 2023-12-17 | Discharge: 2023-12-17 | Disposition: A | Discharge status: Home / Self Care | Attending: Emergency Medicine | Admitting: Emergency Medicine

## 2023-12-17 DIAGNOSIS — R519 Headache, unspecified: Secondary | ICD-10-CM | POA: Insufficient documentation

## 2023-12-17 DIAGNOSIS — F0390 Unspecified dementia without behavioral disturbance: Secondary | ICD-10-CM | POA: Diagnosis not present

## 2023-12-17 DIAGNOSIS — W19XXXA Unspecified fall, initial encounter: Secondary | ICD-10-CM | POA: Diagnosis not present

## 2023-12-17 LAB — CBC
HCT: 36.9 % (ref 36.0–46.0)
Hemoglobin: 12 g/dL (ref 12.0–15.0)
MCH: 30.6 pg (ref 26.0–34.0)
MCHC: 32.5 g/dL (ref 30.0–36.0)
MCV: 94.1 fL (ref 80.0–100.0)
Platelets: 256 K/uL (ref 150–400)
RBC: 3.92 MIL/uL (ref 3.87–5.11)
RDW: 13.4 % (ref 11.5–15.5)
WBC: 7.8 K/uL (ref 4.0–10.5)
nRBC: 0 % (ref 0.0–0.2)

## 2023-12-17 LAB — BASIC METABOLIC PANEL WITH GFR
Anion gap: 9 (ref 5–15)
BUN: 22 mg/dL (ref 8–23)
CO2: 27 mmol/L (ref 22–32)
Calcium: 9.4 mg/dL (ref 8.9–10.3)
Chloride: 102 mmol/L (ref 98–111)
Creatinine, Ser: 0.56 mg/dL (ref 0.44–1.00)
GFR, Estimated: >60 mL/min (ref >60)
Glucose, Bld: 99 mg/dL (ref 70–99)
Potassium: 3.7 mmol/L (ref 3.5–5.1)
Sodium: 138 mmol/L (ref 135–145)

## 2023-12-17 NOTE — ED Notes (Signed)
 [facility] to transport pt back to facility at this time.

## 2023-12-17 NOTE — ED Notes (Signed)
 Secretary to [facility] to transport pt to [facility]. Family at bedside waiting for pt to be transported back to facility

## 2023-12-17 NOTE — ED Notes (Signed)
 Crackers, peanut butter, apple sauce and water provided to pt and patient family while waiting for transport.

## 2023-12-17 NOTE — ED Triage Notes (Signed)
 Pt arrives via [facility] from [facility] with c/o an unwitnessed fall today with a possible injury to her head. Pt is c/o a headache. Pt is alert to self only which is their baseline.

## 2023-12-17 NOTE — ED Provider Notes (Signed)
   [facility] Provider Note    Event Date/Time   First MD Initiated Contact with Patient 12/17/23 1713     (approximate)   History   Fall  HPI  Jane Bailey is a 89 y.o. female with history of dementia who presents after unwitnessed fall, she is here with family.  She has no complaints     Physical Exam   Triage Vital Signs: ED Triage Vitals  Encounter Vitals Group     BP 12/17/23 1457 136/74     Girls Systolic BP Percentile --      Girls Diastolic BP Percentile --      Boys Systolic BP Percentile --      Boys Diastolic BP Percentile --      Pulse Rate 12/17/23 1457 85     Resp 12/17/23 1457 20     Temp 12/17/23 1457 97.7 F (36.5 C)     Temp Source 12/17/23 1457 Oral     SpO2 12/17/23 1457 90 %     Weight 12/17/23 1509 52.9 kg (116 lb 9.6 oz)     Height 12/17/23 1509 1.727 m (5' 8)     Head Circumference --      Peak Flow --      Pain Score --      Pain Loc --      Pain Education --      Exclude from Growth Chart --     Most recent vital signs: Vitals:   12/17/23 1457 12/17/23 1509  BP: 136/74   Pulse: 85   Resp: 20   Temp: 97.7 F (36.5 C)   SpO2: 90% 99%     General: Awake, no distress.  CV:  Good peripheral perfusion.  Resp:  Normal effort.  Abd:  No distention.  Soft, nontender Other:  Reassuring exam, normal range of motion of all extremities, no pain with axial load on both hips, no vertebral tenderness palpation, no chest wall tenderness palpation   ED Results / Procedures / Treatments   Labs (all labs ordered are listed, but only abnormal results are displayed) Labs Reviewed  CBC  BASIC METABOLIC PANEL WITH GFR     EKG     RADIOLOGY  CT head and cervical spine are reassuring   PROCEDURES:  Critical Care performed:   Procedures   MEDICATIONS ORDERED IN ED: Medications - No data to display   IMPRESSION / MDM / ASSESSMENT AND PLAN / ED COURSE  I reviewed the triage vital signs and the  nursing notes. Patient's presentation is most consistent with acute presentation with potential threat to life or bodily function.  Patient presents with unwitnessed fall, she is well-appearing and in no acute distress, family reports she is at her baseline, they are concerned about a possible head injury.  No abnormal exam findings.  Lab work reviewed and is reassuring, CT head and cervical spine are unremarkable  No indication for admission at this time, appropriate for discharge with outpatient follow        FINAL CLINICAL IMPRESSION(S) / ED DIAGNOSES   Final diagnoses:  Fall, initial encounter     Rx / DC Orders   ED Discharge Orders     None        Note:  This document was prepared using Dragon voice recognition software and may include unintentional dictation errors.   Arlander Charleston, MD 12/17/23 [phone]

## 2023-12-17 NOTE — ED Triage Notes (Signed)
 Pt comes via EMS from [facility] with c/o fall. Pt fell yesterday. Pt has dementia. Pt fall was unwitnessed. Pt did c/o headache but always does according to facility. Pt is at baseline.   Family wanted pt sent to get checked out . Pt not on thinners.

## 2023-12-17 NOTE — ED Notes (Signed)
Blue top sent to lab if needed.
